# Patient Record
Sex: Male | Born: 1945
Health system: Southern US, Community
[De-identification: ages and names within clinical notes are randomized; demographics above are authoritative.]

## PROBLEM LIST (undated history)

## (undated) DIAGNOSIS — T7840XA Allergy, unspecified, initial encounter: Secondary | ICD-10-CM

## (undated) DIAGNOSIS — E785 Hyperlipidemia, unspecified: Secondary | ICD-10-CM

## (undated) DIAGNOSIS — I10 Essential (primary) hypertension: Secondary | ICD-10-CM

## (undated) DIAGNOSIS — N529 Male erectile dysfunction, unspecified: Secondary | ICD-10-CM

## (undated) DIAGNOSIS — D369 Benign neoplasm, unspecified site: Secondary | ICD-10-CM

## (undated) HISTORY — DX: Hyperlipidemia, unspecified: E78.5

## (undated) HISTORY — DX: Essential (primary) hypertension: I10

## (undated) HISTORY — DX: Benign neoplasm, unspecified site: D36.9

## (undated) HISTORY — DX: Male erectile dysfunction, unspecified: N52.9

## (undated) HISTORY — DX: Allergy, unspecified, initial encounter: T78.40XA

## (undated) HISTORY — PX: JOINT REPLACEMENT: SHX530

---

## 1999-06-19 ENCOUNTER — Encounter (INDEPENDENT_AMBULATORY_CARE_PROVIDER_SITE_OTHER): Payer: Self-pay | Admitting: Specialist

## 1999-06-19 ENCOUNTER — Other Ambulatory Visit: Admission: RE | Admit: 1999-06-19 | Discharge: 1999-06-19 | Payer: Self-pay | Admitting: Gastroenterology

## 2001-11-30 ENCOUNTER — Encounter: Payer: Self-pay | Admitting: Internal Medicine

## 2001-11-30 ENCOUNTER — Encounter: Admission: RE | Admit: 2001-11-30 | Discharge: 2001-11-30 | Payer: Self-pay | Admitting: Internal Medicine

## 2001-12-08 ENCOUNTER — Encounter: Admission: RE | Admit: 2001-12-08 | Discharge: 2001-12-08 | Payer: Self-pay | Admitting: Internal Medicine

## 2001-12-08 ENCOUNTER — Encounter: Payer: Self-pay | Admitting: Internal Medicine

## 2002-04-13 ENCOUNTER — Encounter: Payer: Self-pay | Admitting: Internal Medicine

## 2002-04-13 ENCOUNTER — Encounter: Admission: RE | Admit: 2002-04-13 | Discharge: 2002-04-13 | Payer: Self-pay | Admitting: Internal Medicine

## 2007-05-18 ENCOUNTER — Ambulatory Visit: Payer: Self-pay | Admitting: Gastroenterology

## 2007-05-31 ENCOUNTER — Ambulatory Visit: Payer: Self-pay | Admitting: Gastroenterology

## 2008-08-22 ENCOUNTER — Ambulatory Visit: Payer: Self-pay | Admitting: Internal Medicine

## 2009-02-13 ENCOUNTER — Ambulatory Visit: Payer: Self-pay | Admitting: Internal Medicine

## 2009-07-25 ENCOUNTER — Ambulatory Visit: Payer: Self-pay | Admitting: Internal Medicine

## 2009-09-25 ENCOUNTER — Ambulatory Visit: Payer: Self-pay | Admitting: Internal Medicine

## 2009-12-25 ENCOUNTER — Ambulatory Visit: Payer: Self-pay | Admitting: Internal Medicine

## 2010-06-07 ENCOUNTER — Ambulatory Visit: Payer: Self-pay | Admitting: Internal Medicine

## 2010-07-23 ENCOUNTER — Ambulatory Visit: Payer: Self-pay | Admitting: Internal Medicine

## 2010-10-08 ENCOUNTER — Ambulatory Visit: Payer: Self-pay | Admitting: Internal Medicine

## 2011-07-30 ENCOUNTER — Ambulatory Visit (INDEPENDENT_AMBULATORY_CARE_PROVIDER_SITE_OTHER): Payer: BC Managed Care – PPO | Admitting: Internal Medicine

## 2011-07-30 DIAGNOSIS — Z23 Encounter for immunization: Secondary | ICD-10-CM

## 2011-10-08 ENCOUNTER — Encounter: Payer: Self-pay | Admitting: Internal Medicine

## 2011-10-09 ENCOUNTER — Other Ambulatory Visit: Payer: Self-pay | Admitting: Internal Medicine

## 2011-10-09 ENCOUNTER — Other Ambulatory Visit: Payer: BC Managed Care – PPO | Admitting: Internal Medicine

## 2011-10-09 DIAGNOSIS — I10 Essential (primary) hypertension: Secondary | ICD-10-CM

## 2011-10-09 DIAGNOSIS — E785 Hyperlipidemia, unspecified: Secondary | ICD-10-CM

## 2011-10-09 LAB — LIPID PANEL
Cholesterol: 210 mg/dL — ABNORMAL HIGH (ref 0–200)
LDL Cholesterol: 119 mg/dL — ABNORMAL HIGH (ref 0–99)
Triglycerides: 51 mg/dL (ref <150)

## 2011-10-09 LAB — CBC WITH DIFFERENTIAL/PLATELET
Basophils Absolute: 0 10*3/uL (ref 0.0–0.1)
Basophils Relative: 1 % (ref 0–1)
MCHC: 32 g/dL (ref 30.0–36.0)
Monocytes Absolute: 1 10*3/uL (ref 0.1–1.0)
Neutro Abs: 2 10*3/uL (ref 1.7–7.7)
Neutrophils Relative %: 37 % — ABNORMAL LOW (ref 43–77)
Platelets: 235 10*3/uL (ref 150–400)
RDW: 14.2 % (ref 11.5–15.5)

## 2011-10-09 LAB — COMPREHENSIVE METABOLIC PANEL
ALT: 30 U/L (ref 0–53)
CO2: 28 mEq/L (ref 19–32)
Calcium: 10 mg/dL (ref 8.4–10.5)
Chloride: 101 mEq/L (ref 96–112)
Creat: 0.86 mg/dL (ref 0.50–1.35)
Total Protein: 7 g/dL (ref 6.0–8.3)

## 2011-10-10 ENCOUNTER — Encounter: Payer: Self-pay | Admitting: Internal Medicine

## 2011-10-10 ENCOUNTER — Ambulatory Visit (INDEPENDENT_AMBULATORY_CARE_PROVIDER_SITE_OTHER): Payer: BC Managed Care – PPO | Admitting: Internal Medicine

## 2011-10-10 DIAGNOSIS — I10 Essential (primary) hypertension: Secondary | ICD-10-CM

## 2011-10-10 DIAGNOSIS — F411 Generalized anxiety disorder: Secondary | ICD-10-CM

## 2011-10-10 DIAGNOSIS — J309 Allergic rhinitis, unspecified: Secondary | ICD-10-CM

## 2011-10-10 DIAGNOSIS — F419 Anxiety disorder, unspecified: Secondary | ICD-10-CM

## 2011-10-10 DIAGNOSIS — E785 Hyperlipidemia, unspecified: Secondary | ICD-10-CM

## 2011-10-10 DIAGNOSIS — N529 Male erectile dysfunction, unspecified: Secondary | ICD-10-CM

## 2011-10-10 LAB — PSA: PSA: 1.84 ng/mL (ref <=4.00)

## 2011-10-16 ENCOUNTER — Other Ambulatory Visit: Payer: Self-pay

## 2011-10-16 MED ORDER — LISINOPRIL 20 MG PO TABS: 20.0000 mg | ORAL_TABLET | Freq: Every day | ORAL | Status: DC

## 2011-10-16 MED ORDER — ROSUVASTATIN CALCIUM 10 MG PO TABS: 10.0000 mg | ORAL_TABLET | Freq: Every day | ORAL | Status: DC

## 2011-10-16 MED ORDER — HYDROCHLOROTHIAZIDE 25 MG PO TABS: 25.0000 mg | ORAL_TABLET | Freq: Every day | ORAL | Status: DC

## 2011-10-24 ENCOUNTER — Other Ambulatory Visit: Payer: BC Managed Care – PPO | Admitting: Internal Medicine

## 2011-10-27 ENCOUNTER — Encounter: Payer: Self-pay | Admitting: Internal Medicine

## 2011-10-27 ENCOUNTER — Encounter: Payer: BC Managed Care – PPO | Admitting: Internal Medicine

## 2011-10-27 DIAGNOSIS — I10 Essential (primary) hypertension: Secondary | ICD-10-CM | POA: Insufficient documentation

## 2011-10-27 DIAGNOSIS — N529 Male erectile dysfunction, unspecified: Secondary | ICD-10-CM | POA: Insufficient documentation

## 2011-10-27 DIAGNOSIS — F419 Anxiety disorder, unspecified: Secondary | ICD-10-CM | POA: Insufficient documentation

## 2011-10-27 DIAGNOSIS — E785 Hyperlipidemia, unspecified: Secondary | ICD-10-CM | POA: Insufficient documentation

## 2011-10-27 DIAGNOSIS — J309 Allergic rhinitis, unspecified: Secondary | ICD-10-CM | POA: Insufficient documentation

## 2011-10-27 NOTE — Patient Instructions (Addendum)
Continue same medications. We will notify you with results regarding hepatitis C antibody and other fasting lab work. Return in 6 months. Continue to watch diet and exercise.

## 2011-10-27 NOTE — Progress Notes (Signed)
  Subjective:    Patient ID: Andrew Cole, male    DOB: [birth year], 65 y.o.   MRN: [id number]  HPI 65 year old white male with history of hypertension, allergic rhinitis, anxiety, hyperlipidemia, erectile dysfunction in today for evaluation of medical problems. The most part his been doing well. 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 Stays physically fit doing this type of work.    Review of Systems  Constitutional: Negative.   HENT: Positive for rhinorrhea.   Eyes: Negative.   Respiratory: Negative.   Cardiovascular: Negative.   Gastrointestinal: Negative.   Genitourinary:       Erectile dysfunction  Musculoskeletal: Positive for back pain.  Neurological: Negative.   Hematological: Negative.   Psychiatric/Behavioral:       Some anxiety at times       Objective:   Physical Exam  Vitals reviewed. Constitutional: He is oriented to person, place, and time. He appears well-developed and well-nourished. No distress.  HENT:  Head: Normocephalic and atraumatic.  Right Ear: External ear normal.  Left Ear: External ear normal.  Mouth/Throat: Oropharynx is clear and moist. No oropharyngeal exudate.  Eyes: Conjunctivae and EOM are normal. Pupils are equal, round, and reactive to light. Right eye exhibits no discharge. Left eye exhibits no discharge. No scleral icterus.  Neck: Neck supple. No JVD present. No thyromegaly present.  Cardiovascular: Normal rate, regular rhythm, normal heart sounds and intact distal pulses.   No murmur heard. Pulmonary/Chest: Effort normal and breath sounds normal. He has no wheezes. He has no rales.  Abdominal: Soft. Bowel sounds are normal. He exhibits no distension and no mass. There is no tenderness. There is no rebound and no guarding.  Genitourinary: Prostate normal.  Musculoskeletal: Normal range of motion.  Lymphadenopathy:    He has no cervical adenopathy.  Neurological: He is  alert and oriented to person, place, and time. He has normal reflexes. No cranial nerve deficit. Coordination normal.  Skin: Skin is warm and dry. No rash noted.  Psychiatric: He has a normal mood and affect. His behavior is normal. Judgment and thought content normal.          Assessment & Plan:  Hypertension  Allergic rhinitis  Anxiety  Erectile dysfunction  Hyperlipidemia  Plan: Return in 6 months for office visit, blood pressure check and lipid panel. At his request check antibody to hepatitis C. He found out that an old girlfriend apparently had died from chronic hepatitis C. Addendum hepatitis C antibody is negative

## 2011-11-10 ENCOUNTER — Other Ambulatory Visit: Payer: Self-pay | Admitting: Internal Medicine

## 2012-01-05 ENCOUNTER — Telehealth: Payer: Self-pay | Admitting: Gastroenterology

## 2012-01-05 ENCOUNTER — Telehealth: Payer: Self-pay | Admitting: Internal Medicine

## 2012-01-05 NOTE — Telephone Encounter (Signed)
01-05-12 TRANSFERRED GI CARE TO DR MEDOFF/YF

## 2012-02-23 ENCOUNTER — Other Ambulatory Visit: Payer: Self-pay | Admitting: Dermatology

## 2012-03-29 ENCOUNTER — Other Ambulatory Visit: Payer: Self-pay

## 2012-03-29 ENCOUNTER — Other Ambulatory Visit: Payer: Self-pay | Admitting: Internal Medicine

## 2012-04-12 ENCOUNTER — Other Ambulatory Visit: Payer: BC Managed Care – PPO | Admitting: Internal Medicine

## 2012-04-12 DIAGNOSIS — Z79899 Other long term (current) drug therapy: Secondary | ICD-10-CM

## 2012-04-12 DIAGNOSIS — E785 Hyperlipidemia, unspecified: Secondary | ICD-10-CM

## 2012-04-12 LAB — LIPID PANEL
Cholesterol: 208 mg/dL — ABNORMAL HIGH (ref 0–200)
LDL Cholesterol: 112 mg/dL — ABNORMAL HIGH (ref 0–99)
VLDL: 27 mg/dL (ref 0–40)

## 2012-04-12 LAB — HEPATIC FUNCTION PANEL
Bilirubin, Direct: 0.1 mg/dL (ref 0.0–0.3)
Indirect Bilirubin: 0.2 mg/dL (ref 0.0–0.9)
Total Bilirubin: 0.3 mg/dL (ref 0.3–1.2)

## 2012-04-13 ENCOUNTER — Ambulatory Visit (INDEPENDENT_AMBULATORY_CARE_PROVIDER_SITE_OTHER): Payer: BC Managed Care – PPO | Admitting: Internal Medicine

## 2012-04-13 ENCOUNTER — Encounter: Payer: Self-pay | Admitting: Internal Medicine

## 2012-04-13 VITALS — BP 136/76 | HR 80 | Temp 98.8°F | Ht 66.0 in | Wt 177.0 lb

## 2012-04-13 DIAGNOSIS — E785 Hyperlipidemia, unspecified: Secondary | ICD-10-CM

## 2012-04-13 DIAGNOSIS — I10 Essential (primary) hypertension: Secondary | ICD-10-CM

## 2012-04-13 DIAGNOSIS — J309 Allergic rhinitis, unspecified: Secondary | ICD-10-CM

## 2012-04-13 DIAGNOSIS — N529 Male erectile dysfunction, unspecified: Secondary | ICD-10-CM

## 2012-04-13 DIAGNOSIS — Z23 Encounter for immunization: Secondary | ICD-10-CM

## 2012-04-26 NOTE — Progress Notes (Signed)
  Subjective:    Patient ID: Andrew Cole, male    DOB: 07/19/1946, 66 y.o.   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 Basically doing well without any significant complaints. Needs a few medications refilled. Has had fasting lipid panel liver functions which are entirely within normal limits on lipid-lowering medication. Uses Flonase nasal spray generic for allergic rhinitis symptoms. Occasionally needs to use an albuterol inhaler. Blood pressure under excellent control on current regimen. 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    Review of Systems     Objective:   Physical Exam HEENT exam TMs and pharynx are clear. Neck is supple without JVD thyromegaly or carotid bruits. Chest clear to auscultation. Cardiac exam regular rate and rhythm normal S1 and S2. Abdomen no bruits no hepatosplenomegaly masses or tenderness. Extremities without edema. He is alert and oriented.        Assessment & Plan:  Hypertension-well controlled  Hyperlipidemia-well controlled  Allergic rhinitis-controlled  Anxiety-occasionally needs medication for that  Erectile dysfunction treated with Cialis or Viagra  Plan: Return in 6 months for physical examination. Okay to refill medications until that time.

## 2012-07-28 ENCOUNTER — Ambulatory Visit (INDEPENDENT_AMBULATORY_CARE_PROVIDER_SITE_OTHER): Payer: Medicare Other | Admitting: Internal Medicine

## 2012-07-28 DIAGNOSIS — Z23 Encounter for immunization: Secondary | ICD-10-CM

## 2012-10-18 ENCOUNTER — Other Ambulatory Visit: Payer: Self-pay | Admitting: Internal Medicine

## 2012-11-04 ENCOUNTER — Other Ambulatory Visit: Payer: Self-pay

## 2012-11-04 MED ORDER — LISINOPRIL 20 MG PO TABS: 20.0000 mg | ORAL_TABLET | Freq: Every day | ORAL | Status: DC

## 2012-11-04 MED ORDER — HYDROCHLOROTHIAZIDE 25 MG PO TABS: 25.0000 mg | ORAL_TABLET | Freq: Every day | ORAL | Status: DC

## 2012-11-04 MED ORDER — ROSUVASTATIN CALCIUM 10 MG PO TABS: 10.0000 mg | ORAL_TABLET | Freq: Every day | ORAL | Status: DC

## 2012-11-07 ENCOUNTER — Other Ambulatory Visit: Payer: Self-pay | Admitting: Internal Medicine

## 2012-11-09 ENCOUNTER — Other Ambulatory Visit: Payer: Self-pay

## 2012-11-09 MED ORDER — CRESTOR 10 MG PO TABS: 10.0000 mg | ORAL_TABLET | Freq: Every day | ORAL | Status: DC

## 2012-11-09 MED ORDER — HYDROCHLOROTHIAZIDE 25 MG PO TABS: 25.0000 mg | ORAL_TABLET | Freq: Every day | ORAL | Status: DC

## 2012-11-09 MED ORDER — LISINOPRIL 20 MG PO TABS: 20.0000 mg | ORAL_TABLET | Freq: Every day | ORAL | Status: DC

## 2012-11-09 NOTE — Telephone Encounter (Signed)
Rx sent to [facility] today

## 2012-11-19 ENCOUNTER — Other Ambulatory Visit: Payer: Self-pay | Admitting: Internal Medicine

## 2012-12-20 ENCOUNTER — Other Ambulatory Visit: Payer: Self-pay | Admitting: Internal Medicine

## 2012-12-20 ENCOUNTER — Other Ambulatory Visit: Payer: Self-pay

## 2012-12-20 NOTE — Telephone Encounter (Signed)
Note from 6/13 says pt was supposed to have CPE in 6 months. Please check with michelle amd call patient.

## 2013-05-02 ENCOUNTER — Other Ambulatory Visit: Payer: Medicare Other | Admitting: Internal Medicine

## 2013-05-02 DIAGNOSIS — Z125 Encounter for screening for malignant neoplasm of prostate: Secondary | ICD-10-CM

## 2013-05-02 DIAGNOSIS — E785 Hyperlipidemia, unspecified: Secondary | ICD-10-CM

## 2013-05-02 DIAGNOSIS — Z130 Encounter for screening for diseases of the blood and blood-forming organs and certain disorders involving the immune mechanism: Secondary | ICD-10-CM

## 2013-05-02 DIAGNOSIS — I10 Essential (primary) hypertension: Secondary | ICD-10-CM

## 2013-05-02 LAB — CBC WITH DIFFERENTIAL/PLATELET
Basophils Absolute: 0 10*3/uL (ref 0.0–0.1)
Eosinophils Absolute: 0.2 10*3/uL (ref 0.0–0.7)
Eosinophils Relative: 3 % (ref 0–5)
Lymphs Abs: 1.3 10*3/uL (ref 0.7–4.0)
MCH: 32 pg (ref 26.0–34.0)
MCV: 93.5 fL (ref 78.0–100.0)
Monocytes Absolute: 0.9 10*3/uL (ref 0.1–1.0)
Platelets: 214 10*3/uL (ref 150–400)
RDW: 14.4 % (ref 11.5–15.5)

## 2013-05-02 LAB — COMPREHENSIVE METABOLIC PANEL
ALT: 39 U/L (ref 0–53)
Albumin: 4.5 g/dL (ref 3.5–5.2)
BUN: 15 mg/dL (ref 6–23)
CO2: 26 mEq/L (ref 19–32)
Calcium: 9.7 mg/dL (ref 8.4–10.5)
Chloride: 104 mEq/L (ref 96–112)
Creat: 0.95 mg/dL (ref 0.50–1.35)

## 2013-05-02 LAB — LIPID PANEL
Cholesterol: 191 mg/dL (ref 0–200)
Total CHOL/HDL Ratio: 2.5 Ratio

## 2013-05-03 ENCOUNTER — Ambulatory Visit (INDEPENDENT_AMBULATORY_CARE_PROVIDER_SITE_OTHER): Payer: Medicare Other | Admitting: Internal Medicine

## 2013-05-03 ENCOUNTER — Encounter: Payer: Self-pay | Admitting: Internal Medicine

## 2013-05-03 VITALS — BP 128/76 | HR 80 | Temp 98.8°F | Ht 66.0 in | Wt 177.0 lb

## 2013-05-03 DIAGNOSIS — J309 Allergic rhinitis, unspecified: Secondary | ICD-10-CM

## 2013-05-03 DIAGNOSIS — F411 Generalized anxiety disorder: Secondary | ICD-10-CM

## 2013-05-03 DIAGNOSIS — E785 Hyperlipidemia, unspecified: Secondary | ICD-10-CM

## 2013-05-03 DIAGNOSIS — Z86010 Personal history of colonic polyps: Secondary | ICD-10-CM

## 2013-05-03 DIAGNOSIS — Z0000 Encounter for general adult medical examination without abnormal findings: Secondary | ICD-10-CM

## 2013-05-03 DIAGNOSIS — I10 Essential (primary) hypertension: Secondary | ICD-10-CM

## 2013-05-03 DIAGNOSIS — J069 Acute upper respiratory infection, unspecified: Secondary | ICD-10-CM

## 2013-05-03 DIAGNOSIS — R972 Elevated prostate specific antigen [PSA]: Secondary | ICD-10-CM

## 2013-05-03 LAB — POCT URINALYSIS DIPSTICK
Bilirubin, UA: NEGATIVE
Glucose, UA: NEGATIVE
Leukocytes, UA: NEGATIVE
pH, UA: 6

## 2013-05-03 NOTE — Progress Notes (Signed)
Subjective:    Patient ID: Andrew Cole, male    DOB: 1946/06/23, 67 y.o.   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 Today has acute upper respiratory infection present for several days. Feels he is getting some better. Doesn't feel he needs treatment for it.  Medical problems include hypertension and hyperlipidemia. History of allergic rhinitis. History of mild anxiety. History of erectile dysfunction.  He stays physically fit doing lots of outside activities.  Review of lab work today shows elevated PSA in the slightly over 4 range. He's never had this before. He does use Viagra for erectile dysfunction. Having some BPH symptoms.  History of adenomatous colon polyps. Had colonoscopy 2008 by Dr. Jarold Motto. Has had herpes zoster vaccine in the past.  Intolerant of Zocor causes myalgias. Penicillin causes GI complaints. Is able to tolerate Crestor for hyperlipidemia.  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 He is married. 2 adult sons. Currently nonsmoker. Used to smoke. Social alcohol consumption.  Family history: Mother died with metastatic ovarian cancer. Father with history of colon polyps and hyperlipidemia. One brother in good health. 2 sisters in good health.    Review of Systems  Constitutional: Negative.   HENT: Negative.   Eyes: Negative.   Respiratory:       Recent upper respiratory infection  Cardiovascular: Negative.   Gastrointestinal:       History of adenomatous colon polyps  Endocrine: Negative.   Genitourinary:       Nocturia, erectile dysfunction, urine hesitancy  Allergic/Immunologic: Positive for environmental allergies.  Neurological: Negative.   Hematological: Negative.   Psychiatric/Behavioral:       Mild anxiety       Objective:   Physical Exam  Vitals reviewed. Constitutional: He is oriented to person, place, and time. He appears well-developed  and well-nourished. No distress.  HENT:  Head: Normocephalic and atraumatic.  Right Ear: External ear normal.  Left Ear: External ear normal.  Mouth/Throat: Oropharynx is clear and moist. No oropharyngeal exudate.  Eyes: Conjunctivae are normal. Pupils are equal, round, and reactive to light. Right eye exhibits no discharge. Left eye exhibits no discharge. No scleral icterus.  Neck: Neck supple. No JVD present. No thyromegaly present.  Cardiovascular: Normal rate, normal heart sounds and intact distal pulses.   No murmur heard. Pulmonary/Chest: Effort normal and breath sounds normal. No respiratory distress. He has no rales. He exhibits no tenderness.  Abdominal: Soft. Bowel sounds are normal. He exhibits no distension and no mass. There is no tenderness. There is no rebound and no guarding.  Genitourinary:  Slightly enlarged prostate without nodules  Musculoskeletal: Normal range of motion. He exhibits no edema.  Lymphadenopathy:    He has no cervical adenopathy.  Neurological: He is alert and oriented to person, place, and time. He has normal reflexes. No cranial nerve deficit. Coordination normal.  Skin: Skin is warm and dry. No rash noted. He is not diaphoretic.  Psychiatric: He has a normal mood and affect. Judgment and thought content normal.          Assessment & Plan:  Acute upper respiratory infection-no treatment at this time  BPH symptoms-refer to urologist  Elevated PSA-refer to urologist  Hyperlipidemia-stable with Crestor  Hypertension-stable on current medication  Allergic rhinitis-treated with steroid nasal spray  Anxiety-treated sparingly with Xanax  Plan: Urology consultation. Patient is to return here in 6 months for office visit lipid panel,  liver functions, and blood pressure check      Subjective:   Patient presents for Medicare Annual/Subsequent preventive examination.   Review Past Medical/Family/Social: see EPIC   Risk Factors  Current  exercise habits:  Dietary issues discussed:   Cardiac risk factors: HTN Hyperlipidemia  Depression Screen  (Note: if answer to either of the following is "Yes", a more complete depression screening is indicated)   Over the past two weeks, have you felt down, depressed or hopeless? No  Over the past two weeks, have you felt little interest or pleasure in doing things? No Have you lost interest or pleasure in daily life? No Do you often feel hopeless? No Do you cry easily over simple problems? No   Activities of Daily Living  In your present state of health, do you have any difficulty performing the following activities?:   Driving? No  Managing money? No  Feeding yourself? No  Getting from bed to chair? No  Climbing a flight of stairs? No  Preparing food and eating?: No  Bathing or showering? No  Getting dressed: No  Getting to the toilet? No  Using the toilet:No  Moving around from place to place: No  In the past year have you fallen or had a near fall?:No  Are you sexually active? yes Do you have more than one partner? No   Hearing Difficulties: No  Do you often ask people to speak up or repeat themselves? No  Do you experience ringing or noises in your ears? No  Do you have difficulty understanding soft or whispered voices? No  Do you feel that you have a problem with memory? No Do you often misplace items? No    Home Safety:  Do you have a smoke alarm at your residence? Yes Do you have grab bars in the bathroom? no Do you have throw rugs in your house? no   Cognitive Testing  Alert? Yes Normal Appearance?Yes  Oriented to person? Yes Place? Yes  Time? Yes  Recall of three objects? Yes  Can perform simple calculations? Yes  Displays appropriate judgment?Yes  Can read the correct time from a watch face?Yes   List the Names of Other Physician/Practitioners you currently use:  See referral list for the physicians patient is currently seeing. Dr[facility] Lucretia Roers at [facility]    Review of Systems: see EPIC   Objective:     General appearance: Appears stated age and mildly obese  Head: Normocephalic, without obvious abnormality, atraumatic  Eyes: conj clear, EOMi PEERLA  Ears: normal TM's and external ear canals both ears  Nose: Nares normal. Septum midline. Mucosa normal. No drainage or sinus tenderness.  Throat: lips, mucosa, and tongue normal; teeth and gums normal  Neck: no adenopathy, no carotid bruit, no JVD, supple, symmetrical, trachea midline and thyroid not enlarged, symmetric, no tenderness/mass/nodules  No CVA tenderness.  Lungs: clear to auscultation bilaterally  Breasts: normal appearance, no masses or tenderness, top of the pacemaker on left upper chest.  Heart: regular rate and rhythm, S1, S2 normal, no murmur, click, rub or gallop  Abdomen: soft, non-tender; bowel sounds normal; no masses, no organomegaly  Musculoskeletal: ROM normal in all joints, no crepitus, no deformity, Normal muscle strengthen. Back  is symmetric, no curvature. Skin: Skin color, texture, turgor normal. No rashes or lesions  Lymph nodes: Cervical, supraclavicular, and axillary nodes normal.  Neurologic: CN 2 -12 Normal, Normal symmetric reflexes. Normal coordination and gait  Psych: Alert & Oriented x 3,  Mood appear stable.    Assessment:    Annual wellness medicare exam   Plan:    During the course of the visit the patient was educated and counseled about appropriate screening and preventive services including:   Had previous colonoscopy and is to return in 2 years     Patient Instructions (the written plan) was given to the patient.  Medicare Attestation  I have personally reviewed:  The patient's medical and social history  Their use of alcohol, tobacco or illicit drugs  Their current medications and supplements  The patient's functional ability including ADLs,fall risks, home safety risks, cognitive, and hearing and visual impairment   Diet and physical activities  Evidence for depression or mood disorders  The patient's weight, height, BMI, and visual acuity have been recorded in the chart. I have made referrals, counseling, and provided education to the patient based on review of the above and I have provided the patient with a written personalized care plan for preventive services.

## 2013-05-08 ENCOUNTER — Encounter: Payer: Self-pay | Admitting: Internal Medicine

## 2013-05-08 DIAGNOSIS — R972 Elevated prostate specific antigen [PSA]: Secondary | ICD-10-CM | POA: Insufficient documentation

## 2013-05-08 NOTE — Patient Instructions (Addendum)
Urology consultation will be obtained. Continue same medications and return in 6 months

## 2013-08-17 ENCOUNTER — Ambulatory Visit (INDEPENDENT_AMBULATORY_CARE_PROVIDER_SITE_OTHER): Payer: 59 | Admitting: Internal Medicine

## 2013-08-17 DIAGNOSIS — Z23 Encounter for immunization: Secondary | ICD-10-CM

## 2013-11-02 ENCOUNTER — Other Ambulatory Visit: Payer: Self-pay | Admitting: Internal Medicine

## 2013-11-03 ENCOUNTER — Other Ambulatory Visit: Payer: Self-pay

## 2013-11-03 MED ORDER — ALPRAZOLAM 0.5 MG PO TABS: 0.5000 mg | ORAL_TABLET | Freq: Two times a day (BID) | ORAL | Status: DC | PRN

## 2013-11-03 NOTE — Telephone Encounter (Signed)
Refill #[id number] with one refill 

## 2013-11-04 ENCOUNTER — Other Ambulatory Visit: Payer: Self-pay | Admitting: Internal Medicine

## 2013-11-15 ENCOUNTER — Other Ambulatory Visit: Payer: Self-pay

## 2013-11-15 MED ORDER — ALBUTEROL SULFATE HFA 108 (90 BASE) MCG/ACT IN AERS: 2.0000 | INHALATION_SPRAY | Freq: Four times a day (QID) | RESPIRATORY_TRACT | Status: DC | PRN

## 2013-11-19 ENCOUNTER — Other Ambulatory Visit: Payer: Self-pay | Admitting: Internal Medicine

## 2013-11-20 NOTE — Telephone Encounter (Signed)
Please schedule for 6 month follow up if does not already have appt. Before refilling meds.

## 2013-11-21 ENCOUNTER — Other Ambulatory Visit: Payer: Self-pay

## 2013-12-20 ENCOUNTER — Other Ambulatory Visit: Payer: 59 | Admitting: Internal Medicine

## 2013-12-22 ENCOUNTER — Ambulatory Visit: Payer: 59 | Admitting: Internal Medicine

## 2014-01-17 ENCOUNTER — Other Ambulatory Visit: Payer: Medicare Other | Admitting: Internal Medicine

## 2014-01-17 DIAGNOSIS — E785 Hyperlipidemia, unspecified: Secondary | ICD-10-CM

## 2014-01-17 DIAGNOSIS — Z79899 Other long term (current) drug therapy: Secondary | ICD-10-CM

## 2014-01-17 LAB — LIPID PANEL
Cholesterol: 198 mg/dL (ref 0–200)
HDL: 87 mg/dL (ref >39)
LDL Cholesterol: 94 mg/dL (ref 0–99)
TRIGLYCERIDES: 86 mg/dL (ref <150)
Total CHOL/HDL Ratio: 2.3 Ratio
VLDL: 17 mg/dL (ref 0–40)

## 2014-01-17 LAB — HEPATIC FUNCTION PANEL
ALBUMIN: 4.7 g/dL (ref 3.5–5.2)
ALK PHOS: 44 U/L (ref 39–117)
ALT: 35 U/L (ref 0–53)
AST: 22 U/L (ref 0–37)
BILIRUBIN TOTAL: 0.5 mg/dL (ref 0.2–1.2)
Bilirubin, Direct: 0.1 mg/dL (ref 0.0–0.3)
Indirect Bilirubin: 0.4 mg/dL (ref 0.2–1.2)
TOTAL PROTEIN: 7.3 g/dL (ref 6.0–8.3)

## 2014-01-19 ENCOUNTER — Ambulatory Visit (INDEPENDENT_AMBULATORY_CARE_PROVIDER_SITE_OTHER): Payer: 59 | Admitting: Internal Medicine

## 2014-01-19 ENCOUNTER — Encounter: Payer: Self-pay | Admitting: Internal Medicine

## 2014-01-19 VITALS — BP 126/72 | HR 80 | Temp 98.8°F | Wt 180.0 lb

## 2014-01-19 DIAGNOSIS — Z23 Encounter for immunization: Secondary | ICD-10-CM

## 2014-01-19 DIAGNOSIS — I10 Essential (primary) hypertension: Secondary | ICD-10-CM

## 2014-01-19 DIAGNOSIS — E785 Hyperlipidemia, unspecified: Secondary | ICD-10-CM

## 2014-01-19 DIAGNOSIS — J309 Allergic rhinitis, unspecified: Secondary | ICD-10-CM

## 2014-01-19 DIAGNOSIS — T783XXA Angioneurotic edema, initial encounter: Secondary | ICD-10-CM

## 2014-01-19 MED ORDER — PNEUMOCOCCAL VAC POLYVALENT 25 MCG/0.5ML IJ INJ: 0.5000 mL | INJECTION | INTRAMUSCULAR | Status: DC

## 2014-01-19 NOTE — Addendum Note (Signed)
Addended by: Brett Canales on: 01/19/2014 11:35 AM   Modules accepted: Orders

## 2014-01-19 NOTE — Patient Instructions (Signed)
Pneumovax immunization given today. Continue same medications and return in 6 months. If angioedema recurs, take Benadryl by mouth immediately

## 2014-01-19 NOTE — Progress Notes (Signed)
   Subjective:    Patient ID: Andrew Cole, male    DOB: 12-Jan-1946, 68 y.o.   MRN: [id number]  HPI In today for six-month followup on hyperlipidemia and hypertension. History of allergic rhinitis and anxiety. He saw urologist recently and received a good report with history of elevated PSA. He wants to get 20 mg of Crestor and cut it in half but I don't think that's a good idea. Would prefer that he does stay with a 10 mg tablet daily. From time to time has episodes of angioedema when lipid and face  the and swelled. Told him to think about what he has eaten the night before when this happens. He can take Benadryl for it. Seldom happens. He went to his son's house recently and had issues with  allergy symptoms and difficulty breathing. Inhaler had expired. He apparently is allergic to animals. He's not had Pneumovax immunization so that will be given today.    Review of Systems     Objective:   Physical ExamSkin warm and dry. Nodes none. Neck is supple without JVD thyromegaly or carotid bruits. Chest clear to auscultation. Cardiac exam regular rate and rhythm normal S1 and S2. Extremities without edema.          Assessment & Plan:  Hyperlipidemia-lipid panel liver functions are normal on Crestor 10 mg daily. Gave him some samples of Crestorlan:    Hypertension-stable on current regimen  Hyperlipidemia-stable on Crestor 10 mg daily. Gave him some samples today.  Angioedema-is to take Benadryl immediately at onset of angioedema symptoms and consider what foods he has eaten previously to cause this. Not sure what is causing angioedema but it seldom occurs.  Elevated PSA-recently saw urologist and is stable  History of anxiety  Plan: Return in 6 months for physical exam. Pneumovax immunization given today. Continue same medications. Should angioedema recur take Benadryl immediately. Keep inhaler refilled and current.

## 2014-04-06 ENCOUNTER — Other Ambulatory Visit: Payer: Self-pay | Admitting: Internal Medicine

## 2014-04-06 NOTE — Telephone Encounter (Signed)
Due in Sept for CPE No appt booked Please call and arrange before refilling.

## 2014-04-10 ENCOUNTER — Other Ambulatory Visit: Payer: Self-pay | Admitting: Internal Medicine

## 2014-04-10 NOTE — Telephone Encounter (Signed)
Due for PE after 05/03/2014. Can only refill for 30 days. He needs CPE appt.

## 2014-04-12 ENCOUNTER — Telehealth: Payer: Self-pay | Admitting: Internal Medicine

## 2014-04-12 DIAGNOSIS — I10 Essential (primary) hypertension: Secondary | ICD-10-CM

## 2014-04-12 MED ORDER — HYDROCHLOROTHIAZIDE 25 MG PO TABS
ORAL_TABLET | ORAL | Status: DC
Start: 2014-04-12 — End: 2014-04-13

## 2014-04-12 MED ORDER — LISINOPRIL 20 MG PO TABS: ORAL_TABLET | ORAL | Status: DC

## 2014-04-12 NOTE — Telephone Encounter (Signed)
Physical exam due after 05/03/2014. Refills were denied until  patient made appointment. He called today very frustrated that he had to have physical exam. He always waits until the last-minute to book physical exam when medications are running out.  CPE booked late July. We'll refill lisinopril and HCTZ until then.

## 2014-04-13 ENCOUNTER — Other Ambulatory Visit: Payer: Self-pay

## 2014-04-13 DIAGNOSIS — I10 Essential (primary) hypertension: Secondary | ICD-10-CM

## 2014-04-13 MED ORDER — HYDROCHLOROTHIAZIDE 25 MG PO TABS: ORAL_TABLET | ORAL | Status: DC

## 2014-04-13 MED ORDER — FLUTICASONE PROPIONATE 50 MCG/ACT NA SUSP: 2.0000 | Freq: Every day | NASAL | Status: DC

## 2014-04-13 MED ORDER — CRESTOR 10 MG PO TABS: 10.0000 mg | ORAL_TABLET | Freq: Every day | ORAL | Status: DC

## 2014-04-13 MED ORDER — LISINOPRIL 20 MG PO TABS: ORAL_TABLET | ORAL | Status: DC

## 2014-05-12 ENCOUNTER — Encounter: Payer: Self-pay | Admitting: Internal Medicine

## 2014-05-12 ENCOUNTER — Telehealth: Payer: Self-pay | Admitting: Internal Medicine

## 2014-05-12 NOTE — Telephone Encounter (Signed)
Last CPE was July 2015 making 6 month recheck due in January which he did not have. He had it in March. Now wants to defer PE from July until September. We can do this this one time but WILL NOT get off schedule in the future and he needs to keep up with this. We have this argument with him everytime. Refill meds through Sept. PE will not be deferred any more after SEPT. If this is not agreeable, needs to find another physician.

## 2014-05-25 ENCOUNTER — Other Ambulatory Visit: Payer: 59 | Admitting: Internal Medicine

## 2014-05-26 ENCOUNTER — Encounter: Payer: 59 | Admitting: Internal Medicine

## 2014-07-10 ENCOUNTER — Other Ambulatory Visit: Payer: 59 | Admitting: Internal Medicine

## 2014-07-11 ENCOUNTER — Encounter: Payer: 59 | Admitting: Internal Medicine

## 2014-07-18 ENCOUNTER — Other Ambulatory Visit: Payer: 59 | Admitting: Internal Medicine

## 2014-07-20 ENCOUNTER — Other Ambulatory Visit (INDEPENDENT_AMBULATORY_CARE_PROVIDER_SITE_OTHER): Payer: 59 | Admitting: Internal Medicine

## 2014-07-20 DIAGNOSIS — E785 Hyperlipidemia, unspecified: Secondary | ICD-10-CM

## 2014-07-20 DIAGNOSIS — Z125 Encounter for screening for malignant neoplasm of prostate: Secondary | ICD-10-CM

## 2014-07-20 DIAGNOSIS — Z23 Encounter for immunization: Secondary | ICD-10-CM

## 2014-07-20 DIAGNOSIS — Z130 Encounter for screening for diseases of the blood and blood-forming organs and certain disorders involving the immune mechanism: Secondary | ICD-10-CM

## 2014-07-20 DIAGNOSIS — Z0000 Encounter for general adult medical examination without abnormal findings: Secondary | ICD-10-CM

## 2014-07-20 DIAGNOSIS — I10 Essential (primary) hypertension: Secondary | ICD-10-CM

## 2014-07-21 ENCOUNTER — Encounter: Payer: Self-pay | Admitting: Internal Medicine

## 2014-07-21 ENCOUNTER — Ambulatory Visit (INDEPENDENT_AMBULATORY_CARE_PROVIDER_SITE_OTHER): Payer: 59 | Admitting: Internal Medicine

## 2014-07-21 VITALS — BP 130/68 | HR 76 | Ht 66.0 in | Wt 182.0 lb

## 2014-07-21 DIAGNOSIS — M25569 Pain in unspecified knee: Secondary | ICD-10-CM

## 2014-07-21 DIAGNOSIS — F411 Generalized anxiety disorder: Secondary | ICD-10-CM

## 2014-07-21 DIAGNOSIS — Z0000 Encounter for general adult medical examination without abnormal findings: Secondary | ICD-10-CM

## 2014-07-21 DIAGNOSIS — R220 Localized swelling, mass and lump, head: Secondary | ICD-10-CM

## 2014-07-21 DIAGNOSIS — J309 Allergic rhinitis, unspecified: Secondary | ICD-10-CM

## 2014-07-21 DIAGNOSIS — M79609 Pain in unspecified limb: Secondary | ICD-10-CM

## 2014-07-21 DIAGNOSIS — R221 Localized swelling, mass and lump, neck: Secondary | ICD-10-CM

## 2014-07-21 DIAGNOSIS — I10 Essential (primary) hypertension: Secondary | ICD-10-CM

## 2014-07-21 DIAGNOSIS — S3992XA Unspecified injury of lower back, initial encounter: Secondary | ICD-10-CM

## 2014-07-21 DIAGNOSIS — E785 Hyperlipidemia, unspecified: Secondary | ICD-10-CM

## 2014-07-21 DIAGNOSIS — M79605 Pain in left leg: Secondary | ICD-10-CM

## 2014-07-21 DIAGNOSIS — N529 Male erectile dysfunction, unspecified: Secondary | ICD-10-CM

## 2014-07-21 LAB — LIPID PANEL
CHOLESTEROL: 173 mg/dL (ref 0–200)
HDL: 76 mg/dL (ref >39)
LDL Cholesterol: 81 mg/dL (ref 0–99)
Total CHOL/HDL Ratio: 2.3 Ratio
Triglycerides: 79 mg/dL (ref <150)
VLDL: 16 mg/dL (ref 0–40)

## 2014-07-21 LAB — COMPREHENSIVE METABOLIC PANEL
ALBUMIN: 4.6 g/dL (ref 3.5–5.2)
ALT: 27 U/L (ref 0–53)
AST: 22 U/L (ref 0–37)
Alkaline Phosphatase: 46 U/L (ref 39–117)
BILIRUBIN TOTAL: 0.5 mg/dL (ref 0.2–1.2)
BUN: 18 mg/dL (ref 6–23)
CO2: 24 meq/L (ref 19–32)
Calcium: 9.9 mg/dL (ref 8.4–10.5)
Chloride: 102 mEq/L (ref 96–112)
Creat: 0.99 mg/dL (ref 0.50–1.35)
GLUCOSE: 86 mg/dL (ref 70–99)
POTASSIUM: 4.6 meq/L (ref 3.5–5.3)
SODIUM: 139 meq/L (ref 135–145)
TOTAL PROTEIN: 7.2 g/dL (ref 6.0–8.3)

## 2014-07-21 LAB — CBC WITH DIFFERENTIAL/PLATELET
Basophils Absolute: 0 10*3/uL (ref 0.0–0.1)
Basophils Relative: 0 % (ref 0–1)
EOS ABS: 0.2 10*3/uL (ref 0.0–0.7)
Eosinophils Relative: 3 % (ref 0–5)
HEMATOCRIT: 42.3 % (ref 39.0–52.0)
HEMOGLOBIN: 14.2 g/dL (ref 13.0–17.0)
Lymphocytes Relative: 37 % (ref 12–46)
Lymphs Abs: 2.5 10*3/uL (ref 0.7–4.0)
MCH: 32.2 pg (ref 26.0–34.0)
MCHC: 33.6 g/dL (ref 30.0–36.0)
MCV: 95.9 fL (ref 78.0–100.0)
MONOS PCT: 13 % — AB (ref 3–12)
Monocytes Absolute: 0.9 10*3/uL (ref 0.1–1.0)
NEUTROS PCT: 47 % (ref 43–77)
Neutro Abs: 3.1 10*3/uL (ref 1.7–7.7)
Platelets: 270 10*3/uL (ref 150–400)
RBC: 4.41 MIL/uL (ref 4.22–5.81)
RDW: 13.9 % (ref 11.5–15.5)
WBC: 6.7 10*3/uL (ref 4.0–10.5)

## 2014-07-21 LAB — POCT URINALYSIS DIPSTICK
Bilirubin, UA: NEGATIVE
Blood, UA: NEGATIVE
GLUCOSE UA: NEGATIVE
Ketones, UA: NEGATIVE
Leukocytes, UA: NEGATIVE
Nitrite, UA: NEGATIVE
SPEC GRAV UA: 1.015
Urobilinogen, UA: NEGATIVE
pH, UA: 6

## 2014-07-21 LAB — PSA: PSA: 1.73 ng/mL (ref <=4.00)

## 2014-07-22 ENCOUNTER — Encounter: Payer: Self-pay | Admitting: Internal Medicine

## 2014-07-22 NOTE — Progress Notes (Signed)
Subjective:    Patient ID: Andrew Cole, male    DOB: 11/11/1945, 68 y.o.   MRN: [id number]  HPI 68 year old white male in today for health maintenance exam and evaluation of medical issues including hypertension, hyperlipidemia, allergic rhinitis, anxiety. History of elevated PSA on one occasion but has returned to within normal limits. History of erectile dysfunction. Since last visit, patient's right cheek became puffy on one occasion. His dentist referred him to an endodontist. He was treated with some antibiotics. At one point the other cheek became puffy. He does recall any foods that could have caused these cheeks to swell. He had no itching or angioedema-type symptoms other than the puffiness and redness of the cheeks. He was told it could be parotid duct problem. He's been having some issues with back pain since he wrenched his back sorting out clams at the beach in June. Pain around his left quadriceps muscle.  He stays physically fit doing a lot of outside activities. In 2014 the PSA was slightly over the 4 range. He saw urologist for examination. Has some BPH symptoms.  History of adenomatous colon polyps. Had colonoscopy in 2008 by Dr. Sharlett Iles. Has had herpes zoster vaccine in the past.  Intolerant of Zocor causes myalgias. Is able to tolerate Crestor. Penicillin causes GI complaints.  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 He is married. 2 adult sons. He used to smoke but is currently a nonsmoker. Social alcohol consumption.  Family history: Mother died with metastatic ovarian cancer. Father with history of colon polyps and hyperlipidemia apparently died with end-stage COPD. One brother in good health. 2 sisters in good health.    Review of Systems  issues with cheek swelling as described above and back pain. Otherwise negative     Objective:   Physical Exam  Vitals reviewed. Constitutional: He appears well-developed and well-nourished.  No distress.  HENT:  Head: Normocephalic and atraumatic.  Right Ear: External ear normal.  Left Ear: External ear normal.  Mouth/Throat: Oropharynx is clear and moist. No oropharyngeal exudate.  Eyes: Conjunctivae and EOM are normal. Pupils are equal, round, and reactive to light. Right eye exhibits no discharge. Left eye exhibits no discharge. No scleral icterus.  Neck: Neck supple. No JVD present. No thyromegaly present.  Cardiovascular: Normal rate, normal heart sounds and intact distal pulses.   No murmur heard. Pulmonary/Chest: Effort normal and breath sounds normal. He has no wheezes.  Abdominal: Soft. Bowel sounds are normal. He exhibits no distension and no mass. There is no tenderness. There is no rebound.  Genitourinary: Prostate normal.  Musculoskeletal: Normal range of motion. He exhibits no edema.  tender left quadriceps muscle. Straight leg raising is negative at 90. Deep tendon reflexes 2+ and symmetrical. Muscle strength normal in the lower extremity  Lymphadenopathy:    He has no cervical adenopathy.  Skin: Skin is warm and dry. No rash noted. He is not diaphoretic.  Psychiatric: He has a normal mood and affect. His behavior is normal. Judgment and thought content normal.          Assessment & Plan:  ? Angioedema versus parotid duct obstruction causing swelling in cheeks  Back injury- resulting in left quadriceps muscle pain  Hypertension  Hyperlipidemia  Erectile dysfunction  Allergic rhinitis  History of anxiety  Plan: Return in one year or as needed. He is to undergo physical therapy for several weeks and see back pain resolves. Prescription given for physical  therapy. If back pain does not improve with physical therapy, consider MRI of LS-spine.  Subjective:   Patient presents for Medicare Annual/Subsequent preventive examination.  Review Past Medical/Family/Social:   Risk Factors  Current exercise habits:  Dietary issues discussed:   Cardiac  risk factors:  Depression Screen  (Note: if answer to either of the following is "Yes", a more complete depression screening is indicated)   Over the past two weeks, have you felt down, depressed or hopeless? No  Over the past two weeks, have you felt little interest or pleasure in doing things? No Have you lost interest or pleasure in daily life? No Do you often feel hopeless? No Do you cry easily over simple problems? No   Activities of Daily Living  In your present state of health, do you have any difficulty performing the following activities?:   Driving? No  Managing money? No  Feeding yourself? No  Getting from bed to chair? No  Climbing a flight of stairs? No up until recently when he has left leg pain climbing stairs after back injury Preparing food and eating?: No  Bathing or showering? No  Getting dressed: No  Getting to the toilet? No  Using the toilet:No  Moving around from place to place: No  In the past year have you fallen or had a near fall?:No  Are you sexually active? No  Do you have more than one partner? No   Hearing Difficulties: No  Do you often ask people to speak up or repeat themselves? No  Do you experience ringing or noises in your ears? No  Do you have difficulty understanding soft or whispered voices? No  Do you feel that you have a problem with memory? No Do you often misplace items? No    Home Safety:  Do you have a smoke alarm at your residence? Yes Do you have grab bars in the bathroom? no Do you have throw rugs in your house? no   Cognitive Testing  Alert? Yes Normal Appearance?Yes  Oriented to person? Yes Place? Yes  Time? Yes  Recall of three objects? Yes  Can perform simple calculations? Yes  Displays appropriate judgment?Yes  Can read the correct time from a watch face?Yes   List the Names of Other Physician/Practitioners you currently use:  See referral list for the physicians patient is currently seeing.     Review of  Systems: See above   Objective:     General appearance: Appears stated age  Head: Normocephalic, without obvious abnormality, atraumatic  Eyes: conj clear, EOMi PEERLA  Ears: normal TM's and external ear canals both ears  Nose: Nares normal. Septum midline. Mucosa normal. No drainage or sinus tenderness.  Throat: lips, mucosa, and tongue normal; teeth and gums normal  Neck: no adenopathy, no carotid bruit, no JVD, supple, symmetrical, trachea midline and thyroid not enlarged, symmetric, no tenderness/mass/nodules  No CVA tenderness.  Lungs: clear to auscultation bilaterally  Breasts: normal appearance, no masses or tenderness Heart: regular rate and rhythm, S1, S2 normal, no murmur, click, rub or gallop  Abdomen: soft, non-tender; bowel sounds normal; no masses, no organomegaly  Musculoskeletal: ROM normal in all joints, no crepitus, no deformity, Normal muscle strengthen. Back  is symmetric, no curvature. Skin: Skin color, texture, turgor normal. No rashes or lesions  Lymph nodes: Cervical, supraclavicular, and axillary nodes normal.  Neurologic: CN 2 -12 Normal, Normal symmetric reflexes. Normal coordination and gait  Psych: Alert & Oriented x 3, Mood appear stable.  Assessment:    Annual wellness medicare exam   Plan:    During the course of the visit the patient was educated and counseled about appropriate screening and preventive services including:   Annual PSA     Patient Instructions (the written plan) was given to the patient.  Medicare Attestation  I have personally reviewed:  The patient's medical and social history  Their use of alcohol, tobacco or illicit drugs  Their current medications and supplements  The patient's functional ability including ADLs,fall risks, home safety risks, cognitive, and hearing and visual impairment  Diet and physical activities  Evidence for depression or mood disorders  The patient's weight, height, BMI, and visual acuity have  been recorded in the chart. I have made referrals, counseling, and provided education to the patient based on review of the above and I have provided the patient with a written personalized care plan for preventive services.

## 2014-07-23 NOTE — Patient Instructions (Signed)
Ordered physical therapy for left leg pain and history of back injury. Try this for 3 weeks and let us know symptoms improved. Call us if right cheek swells again. Otherwise return in one year or as needed.

## 2014-08-14 ENCOUNTER — Other Ambulatory Visit: Payer: Self-pay

## 2014-08-14 MED ORDER — ALPRAZOLAM 0.5 MG PO TABS: ORAL_TABLET | ORAL | Status: DC

## 2014-08-14 NOTE — Telephone Encounter (Signed)
Xanax called in per Dr Renold Genta.

## 2014-09-04 ENCOUNTER — Other Ambulatory Visit: Payer: Self-pay | Admitting: Internal Medicine

## 2015-03-20 ENCOUNTER — Encounter: Payer: Self-pay | Admitting: Internal Medicine

## 2015-04-24 ENCOUNTER — Other Ambulatory Visit: Payer: Self-pay | Admitting: *Deleted

## 2015-04-24 MED ORDER — ALPRAZOLAM 0.5 MG PO TABS: ORAL_TABLET | ORAL | Status: DC

## 2015-04-24 NOTE — Telephone Encounter (Signed)
Refilled patients xanax. 

## 2015-05-25 ENCOUNTER — Other Ambulatory Visit: Payer: Self-pay | Admitting: Internal Medicine

## 2015-05-25 NOTE — Telephone Encounter (Signed)
Spoke with patient regarding appt for CPE he states he will have to check with his wife about dates and call us back they are going to be out of town first part of October . He will call back to schedule.

## 2015-05-25 NOTE — Telephone Encounter (Signed)
Due for CPE after 9/25 Please call before refilling

## 2015-08-16 ENCOUNTER — Other Ambulatory Visit: Payer: Self-pay | Admitting: Internal Medicine

## 2015-08-17 LAB — LIPID PANEL
CHOL/HDL RATIO: 2.6 ratio (ref <=5.0)
Cholesterol: 190 mg/dL (ref 125–200)
HDL: 74 mg/dL (ref >=40)
LDL CALC: 94 mg/dL (ref <130)
TRIGLYCERIDES: 108 mg/dL (ref <150)
VLDL: 22 mg/dL (ref <30)

## 2015-08-17 LAB — COMPLETE METABOLIC PANEL WITH GFR
ALT: 28 U/L (ref 9–46)
AST: 18 U/L (ref 10–35)
Albumin: 4.2 g/dL (ref 3.6–5.1)
Alkaline Phosphatase: 40 U/L (ref 40–115)
BUN: 21 mg/dL (ref 7–25)
CALCIUM: 9.4 mg/dL (ref 8.6–10.3)
CHLORIDE: 102 mmol/L (ref 98–110)
CO2: 26 mmol/L (ref 20–31)
Creat: 0.94 mg/dL (ref 0.70–1.25)
GFR, Est African American: >89 mL/min (ref >=60)
GFR, Est Non African American: 83 mL/min (ref >=60)
Glucose, Bld: 78 mg/dL (ref 65–99)
POTASSIUM: 4.2 mmol/L (ref 3.5–5.3)
SODIUM: 139 mmol/L (ref 135–146)
Total Bilirubin: 0.7 mg/dL (ref 0.2–1.2)
Total Protein: 7 g/dL (ref 6.1–8.1)

## 2015-08-17 LAB — CBC WITH DIFFERENTIAL/PLATELET
Basophils Absolute: 0 10*3/uL (ref 0.0–0.1)
Basophils Relative: 0 % (ref 0–1)
Eosinophils Absolute: 0.3 10*3/uL (ref 0.0–0.7)
Eosinophils Relative: 4 % (ref 0–5)
HCT: 44.8 % (ref 39.0–52.0)
HEMOGLOBIN: 15 g/dL (ref 13.0–17.0)
LYMPHS ABS: 2.6 10*3/uL (ref 0.7–4.0)
LYMPHS PCT: 38 % (ref 12–46)
MCH: 32.6 pg (ref 26.0–34.0)
MCHC: 33.5 g/dL (ref 30.0–36.0)
MCV: 97.4 fL (ref 78.0–100.0)
MONO ABS: 1 10*3/uL (ref 0.1–1.0)
MPV: 9.4 fL (ref 8.6–12.4)
Monocytes Relative: 14 % — ABNORMAL HIGH (ref 3–12)
NEUTROS ABS: 3 10*3/uL (ref 1.7–7.7)
Neutrophils Relative %: 44 % (ref 43–77)
Platelets: 229 10*3/uL (ref 150–400)
RBC: 4.6 MIL/uL (ref 4.22–5.81)
RDW: 13.9 % (ref 11.5–15.5)
WBC: 6.8 10*3/uL (ref 4.0–10.5)

## 2015-08-18 LAB — PSA: PSA: 1.97 ng/mL (ref <=4.00)

## 2015-08-20 ENCOUNTER — Encounter: Payer: Self-pay | Admitting: Internal Medicine

## 2015-08-20 ENCOUNTER — Ambulatory Visit (INDEPENDENT_AMBULATORY_CARE_PROVIDER_SITE_OTHER): Payer: Medicare Other | Admitting: Internal Medicine

## 2015-08-20 VITALS — BP 132/70 | HR 87 | Temp 97.8°F | Resp 18 | Ht 67.0 in | Wt 184.5 lb

## 2015-08-20 DIAGNOSIS — N400 Enlarged prostate without lower urinary tract symptoms: Secondary | ICD-10-CM

## 2015-08-20 DIAGNOSIS — J309 Allergic rhinitis, unspecified: Secondary | ICD-10-CM

## 2015-08-20 DIAGNOSIS — Z23 Encounter for immunization: Secondary | ICD-10-CM | POA: Diagnosis not present

## 2015-08-20 DIAGNOSIS — I10 Essential (primary) hypertension: Secondary | ICD-10-CM

## 2015-08-20 DIAGNOSIS — Z0000 Encounter for general adult medical examination without abnormal findings: Secondary | ICD-10-CM

## 2015-08-20 DIAGNOSIS — F411 Generalized anxiety disorder: Secondary | ICD-10-CM

## 2015-08-20 DIAGNOSIS — N529 Male erectile dysfunction, unspecified: Secondary | ICD-10-CM

## 2015-08-20 DIAGNOSIS — E785 Hyperlipidemia, unspecified: Secondary | ICD-10-CM

## 2015-08-20 LAB — POCT URINALYSIS DIPSTICK
BILIRUBIN UA: NEGATIVE
GLUCOSE UA: NEGATIVE
LEUKOCYTES UA: NEGATIVE
NITRITE UA: NEGATIVE
PH UA: 7.5
RBC UA: NEGATIVE
Spec Grav, UA: >=1.03
Urobilinogen, UA: 0.2

## 2015-08-20 NOTE — Progress Notes (Signed)
Subjective:    Patient ID: Andrew Cole, male    DOB: 08-08-1946, 69 y.o.   MRN: [id number]  HPI 69 year old white male in today for health maintenance exam. He's gained 2-1/2 pounds since last visit September 2015. He has a history of essential hypertension, hyperlipidemia, erectile dysfunction, low back pain, allergic rhinitis, anxiety. History of elevated PSA and one occasion but it has returned within normal limits over the past 2 years. He saw urologist at that time who thought he might have some mild BPH and prostatitis. PSA at that time in 2014 was in the 4 range.  History of adenomatous colon polyps. Had colonoscopy in 2008 by Dr. Sharlett Iles.  Has had herpes zoster vaccine in the past. Received flu vaccine and Prevnar today.  Intolerant of Zocor causes myalgias. Unable to tolerate Crestor.  Penicillin causes GI complaints.  He stays physically fit and a lot of outside activities.    Review of Systems  Constitutional: Negative.   Respiratory: Negative.   Cardiovascular: Negative.   Genitourinary: Negative.   Musculoskeletal:       Back pain resolved after going to physical therapy. Leg pain resolved after learning to sleep with pillow between his knees  Neurological: Negative.        Objective:   Physical Exam  Constitutional: He is oriented to person, place, and time. He appears well-developed and well-nourished. No distress.  HENT:  Head: Normocephalic and atraumatic.  Right Ear: External ear normal.  Left Ear: External ear normal.  Mouth/Throat: Oropharynx is clear and moist. No oropharyngeal exudate.  Eyes: Conjunctivae and EOM are normal. Pupils are equal, round, and reactive to light. Right eye exhibits no discharge. Left eye exhibits no discharge. No scleral icterus.  Neck: Neck supple. No JVD present. No thyromegaly present.  Cardiovascular: Normal rate, regular rhythm, normal heart sounds and intact distal pulses.   No murmur heard. Pulmonary/Chest: Effort  normal and breath sounds normal. He has no wheezes. He has no rales.  Abdominal: Soft. Bowel sounds are normal. He exhibits no distension. There is no tenderness. There is no rebound and no guarding.  Genitourinary: Prostate normal.  Musculoskeletal: Normal range of motion. He exhibits no edema.  Neurological: He is alert and oriented to person, place, and time. He has normal reflexes. No cranial nerve deficit. Coordination normal.  Skin: Skin is warm and dry. No rash noted. He is not diaphoretic.  Psychiatric: He has a normal mood and affect. His behavior is normal. Judgment and thought content normal.  Vitals reviewed.         Assessment & Plan:  Essential hypertension-blood pressure stable  History of erectile dysfunction  History of BPH  Hyperlipidemia-lipid panel  within normal limits.  Allergic rhinitis  History of anxiety  History of low back pain-resolved  Health maintenance-Prevnar and flu vaccine given  Plan: Return in one year or as needed. Continue same medications.  Subjective:   Patient presents for Medicare Annual/Subsequent preventive examination.  Review Past Medical/Family/Social: See above   Risk Factors  Current exercise habits: Very physically active outdoors Dietary issues discussed: Low fat low carbohydrate  Cardiac risk factors: Hypertension and hyperlipidemia  Depression Screen  (Note: if answer to either of the following is "Yes", a more complete depression screening is indicated)   Over the past two weeks, have you felt down, depressed or hopeless? No  Over the past two weeks, have you felt little interest or pleasure in doing things? No Have you lost interest or pleasure  in daily life? No Do you often feel hopeless? No Do you cry easily over simple problems? No   Activities of Daily Living  In your present state of health, do you have any difficulty performing the following activities?:   Driving? No  Managing money? No  Feeding  yourself? No  Getting from bed to chair? No  Climbing a flight of stairs? No  Preparing food and eating?: No  Bathing or showering? No  Getting dressed: No  Getting to the toilet? No  Using the toilet:No  Moving around from place to place: No  In the past year have you fallen or had a near fall?:No  Are you sexually active? No  Do you have more than one partner? No   Hearing Difficulties: No  Do you often ask people to speak up or repeat themselves? No  Do you experience ringing or noises in your ears? No  Do you have difficulty understanding soft or whispered voices? Yes Do you feel that you have a problem with memory? No Do you often misplace items? No    Home Safety:  Do you have a smoke alarm at your residence? Yes Do you have grab bars in the bathroom? No Do you have throw rugs in your house? No   Cognitive Testing  Alert? Yes Normal Appearance?Yes  Oriented to person? Yes Place? Yes  Time? Yes  Recall of three objects? Yes  Can perform simple calculations? Yes  Displays appropriate judgment?Yes  Can read the correct time from a watch face?Yes   List the Names of Other Physician/Practitioners you currently use:  See referral list for the physicians patient is currently seeing.  No chronic specialty referrals   Review of Systems: See above  Objective:     General appearance: Appears stated age and mildly obese  Head: Normocephalic, without obvious abnormality, atraumatic  Eyes: conj clear, EOMi PEERLA  Ears: normal TM's and external ear canals both ears  Nose: Nares normal. Septum midline. Mucosa normal. No drainage or sinus tenderness.  Throat: lips, mucosa, and tongue normal; teeth and gums normal  Neck: no adenopathy, no carotid bruit, no JVD, supple, symmetrical, trachea midline and thyroid not enlarged, symmetric, no tenderness/mass/nodules  No CVA tenderness.  Lungs: clear to auscultation bilaterally  Breasts: normal appearance, no masses or  tenderness Heart: regular rate and rhythm, S1, S2 normal, no murmur, click, rub or gallop  Abdomen: soft, non-tender; bowel sounds normal; no masses, no organomegaly  Musculoskeletal: ROM normal in all joints, no crepitus, no deformity, Normal muscle strengthen. Back  is symmetric, no curvature. Skin: Skin color, texture, turgor normal. No rashes or lesions  Lymph nodes: Cervical, supraclavicular, and axillary nodes normal.  Neurologic: CN 2 -12 Normal, Normal symmetric reflexes. Normal coordination and gait  Psych: Alert & Oriented x 3, Mood appear stable.    Assessment:    Annual wellness medicare exam   Plan:    During the course of the visit the patient was educated and counseled about appropriate screening and preventive services including:  Prevnar and flu vaccines      Patient Instructions (the written plan) was given to the patient.  Medicare Attestation  I have personally reviewed:  The patient's medical and social history  Their use of alcohol, tobacco or illicit drugs  Their current medications and supplements  The patient's functional ability including ADLs,fall risks, home safety risks, cognitive, and hearing and visual impairment  Diet and physical activities  Evidence for depression or mood disorders  The patient's weight, height, BMI, and visual acuity have been recorded in the chart. I have made referrals, counseling, and provided education to the patient based on review of the above and I have provided the patient with a written personalized care plan for preventive services.

## 2015-08-20 NOTE — Addendum Note (Signed)
Addended by: Beryle Quant on: 08/20/2015 11:27 AM   Modules accepted: Orders

## 2015-08-20 NOTE — Patient Instructions (Signed)
It was pleasure to see you today. Lab work is within normal limits. Continue same medications and return in one year. Prevnar and flu vaccines given.

## 2015-09-03 ENCOUNTER — Other Ambulatory Visit: Payer: Self-pay | Admitting: Internal Medicine

## 2015-10-22 ENCOUNTER — Other Ambulatory Visit: Payer: Self-pay | Admitting: Internal Medicine

## 2015-11-05 ENCOUNTER — Other Ambulatory Visit: Payer: Self-pay | Admitting: Internal Medicine

## 2015-11-05 NOTE — Telephone Encounter (Signed)
Phoned to pharmacy 

## 2015-11-05 NOTE — Telephone Encounter (Signed)
Refill once 

## 2016-01-07 ENCOUNTER — Other Ambulatory Visit: Payer: Self-pay | Admitting: Internal Medicine

## 2016-01-07 NOTE — Telephone Encounter (Signed)
Please book CPE after Oct 24. Ok to refill until then

## 2016-01-07 NOTE — Telephone Encounter (Signed)
Patient contacted and states that he will have to call us back closer to time as he does not have his calender available just yet

## 2016-01-29 ENCOUNTER — Other Ambulatory Visit: Payer: Self-pay | Admitting: Internal Medicine

## 2016-02-04 ENCOUNTER — Other Ambulatory Visit: Payer: Self-pay | Admitting: Internal Medicine

## 2016-02-04 NOTE — Telephone Encounter (Signed)
Refill x 6 months. Go ahead and book CPE after October 24

## 2016-04-26 HISTORY — PX: CATARACT EXTRACTION, BILATERAL: SHX1313

## 2016-05-28 ENCOUNTER — Other Ambulatory Visit: Payer: Self-pay | Admitting: Internal Medicine

## 2016-06-11 ENCOUNTER — Other Ambulatory Visit: Payer: Self-pay | Admitting: Internal Medicine

## 2016-06-12 NOTE — Telephone Encounter (Signed)
Refill once. Has CPE October

## 2016-08-06 ENCOUNTER — Other Ambulatory Visit: Payer: Self-pay | Admitting: Internal Medicine

## 2016-08-06 NOTE — Telephone Encounter (Signed)
Verbal order by Dr. Renold Genta to refill Crestor 10mg , #90, 0 refills.  To [facility] to be e-scribed.

## 2016-08-21 ENCOUNTER — Other Ambulatory Visit (INDEPENDENT_AMBULATORY_CARE_PROVIDER_SITE_OTHER): Payer: Medicare Other | Admitting: Internal Medicine

## 2016-08-21 DIAGNOSIS — Z0000 Encounter for general adult medical examination without abnormal findings: Secondary | ICD-10-CM | POA: Diagnosis not present

## 2016-08-21 DIAGNOSIS — Z125 Encounter for screening for malignant neoplasm of prostate: Secondary | ICD-10-CM

## 2016-08-21 DIAGNOSIS — I10 Essential (primary) hypertension: Secondary | ICD-10-CM

## 2016-08-21 LAB — COMPLETE METABOLIC PANEL WITH GFR
ALT: 31 U/L (ref 9–46)
AST: 24 U/L (ref 10–35)
Albumin: 4.5 g/dL (ref 3.6–5.1)
Alkaline Phosphatase: 41 U/L (ref 40–115)
BILIRUBIN TOTAL: 0.6 mg/dL (ref 0.2–1.2)
BUN: 23 mg/dL (ref 7–25)
CALCIUM: 9.8 mg/dL (ref 8.6–10.3)
CO2: 26 mmol/L (ref 20–31)
CREATININE: 1.14 mg/dL (ref 0.70–1.25)
Chloride: 100 mmol/L (ref 98–110)
GFR, EST AFRICAN AMERICAN: 75 mL/min (ref >=60)
GFR, Est Non African American: 65 mL/min (ref >=60)
Glucose, Bld: 91 mg/dL (ref 65–99)
Potassium: 4.5 mmol/L (ref 3.5–5.3)
Sodium: 137 mmol/L (ref 135–146)
TOTAL PROTEIN: 7.2 g/dL (ref 6.1–8.1)

## 2016-08-21 LAB — CBC WITH DIFFERENTIAL/PLATELET
Basophils Absolute: 0 cells/uL (ref 0–200)
Basophils Relative: 0 %
EOS PCT: 2 %
Eosinophils Absolute: 126 cells/uL (ref 15–500)
HCT: 45.1 % (ref 38.5–50.0)
HEMOGLOBIN: 15.1 g/dL (ref 13.2–17.1)
LYMPHS ABS: 2520 {cells}/uL (ref 850–3900)
LYMPHS PCT: 40 %
MCH: 32.3 pg (ref 27.0–33.0)
MCHC: 33.5 g/dL (ref 32.0–36.0)
MCV: 96.4 fL (ref 80.0–100.0)
MONOS PCT: 16 %
MPV: 9.2 fL (ref 7.5–12.5)
Monocytes Absolute: 1008 cells/uL — ABNORMAL HIGH (ref 200–950)
NEUTROS PCT: 42 %
Neutro Abs: 2646 cells/uL (ref 1500–7800)
PLATELETS: 217 10*3/uL (ref 140–400)
RBC: 4.68 MIL/uL (ref 4.20–5.80)
RDW: 13.7 % (ref 11.0–15.0)
WBC: 6.3 10*3/uL (ref 3.8–10.8)

## 2016-08-21 LAB — LIPID PANEL
CHOLESTEROL: 184 mg/dL (ref 125–200)
HDL: 74 mg/dL (ref >=40)
LDL Cholesterol: 90 mg/dL (ref <130)
TRIGLYCERIDES: 101 mg/dL (ref <150)
Total CHOL/HDL Ratio: 2.5 Ratio (ref <=5.0)
VLDL: 20 mg/dL (ref <30)

## 2016-08-22 ENCOUNTER — Encounter: Payer: Self-pay | Admitting: Internal Medicine

## 2016-08-22 ENCOUNTER — Ambulatory Visit (INDEPENDENT_AMBULATORY_CARE_PROVIDER_SITE_OTHER): Payer: Medicare Other | Admitting: Internal Medicine

## 2016-08-22 VITALS — BP 120/70 | HR 92 | Temp 98.0°F | Ht 66.0 in | Wt 185.0 lb

## 2016-08-22 DIAGNOSIS — E7800 Pure hypercholesterolemia, unspecified: Secondary | ICD-10-CM | POA: Diagnosis not present

## 2016-08-22 DIAGNOSIS — I10 Essential (primary) hypertension: Secondary | ICD-10-CM

## 2016-08-22 DIAGNOSIS — J309 Allergic rhinitis, unspecified: Secondary | ICD-10-CM | POA: Diagnosis not present

## 2016-08-22 DIAGNOSIS — F411 Generalized anxiety disorder: Secondary | ICD-10-CM

## 2016-08-22 DIAGNOSIS — N529 Male erectile dysfunction, unspecified: Secondary | ICD-10-CM

## 2016-08-22 DIAGNOSIS — M79651 Pain in right thigh: Secondary | ICD-10-CM

## 2016-08-22 DIAGNOSIS — Z0000 Encounter for general adult medical examination without abnormal findings: Secondary | ICD-10-CM

## 2016-08-22 DIAGNOSIS — Z23 Encounter for immunization: Secondary | ICD-10-CM

## 2016-08-22 LAB — POC URINALSYSI DIPSTICK (AUTOMATED)
Bilirubin, UA: NEGATIVE
Glucose, UA: NEGATIVE
KETONES UA: NEGATIVE
LEUKOCYTES UA: NEGATIVE
NITRITE UA: NEGATIVE
PH UA: 6
PROTEIN UA: NEGATIVE
RBC UA: NEGATIVE
Spec Grav, UA: 1.015
Urobilinogen, UA: 0.2

## 2016-08-22 MED ORDER — ALPRAZOLAM 0.5 MG PO TABS: 0.5000 mg | ORAL_TABLET | Freq: Two times a day (BID) | ORAL | 0 refills | Status: DC | PRN

## 2016-08-22 NOTE — Patient Instructions (Signed)
It was a pleasure to see you today. Try heat on your thigh as well as Advil or Aleve over the next 2 weeks. Call if not improved. Continue same medications previously prescribed for hypertension and hyperlipidemia. Flu vaccine given today.

## 2016-08-22 NOTE — Progress Notes (Signed)
Subjective:    Patient ID: Andrew Cole, male    DOB: 04-Feb-1946, 70 y.o.   MRN: [id number]  HPI 70 year old Male in today for health maintenance exam and evaluation of medical issues. He has a history of essential hypertension, hyperlipidemia, erectile dysfunction, allergic rhinitis, anxiety. In 2014 he had a PSA in the 4 range. He was seen by urology and thought to have mild BPH and prostatitis. Since that time PSA has returned to normal.  History of adenomatous colon polyps. Had colonoscopy in 2008 by Dr. Sharlett Iles.  Intolerant of Zocor-it causes myalgias. Has been on Crestor but recently developed right thigh pain that he has attributed to Crestor. He has no known injury to the right thigh area. This awakens him from sleep at night from time to time. He has not tried in stage. Only tried Tylenol without much relief.  Social history: Married. 2 adult sons. Does not smoke. Used to smoke in the remote past. Social alcohol consumption. Former Music therapist. Has a Masters degree.  Family history: Mother died with metastatic ovarian cancer. Father with history of colon polyps and hyperlipidemia. One brother and 2 sisters    Review of Systems main complaint is right thigh pain. Denies back pain or pain in right hip joint.     Objective:   Physical Exam  Constitutional: He is oriented to person, place, and time. He appears well-developed and well-nourished. No distress.  HENT:  Head: Normocephalic and atraumatic.  Right Ear: External ear normal.  Left Ear: External ear normal.  Mouth/Throat: Oropharynx is clear and moist. No oropharyngeal exudate.  Eyes: Conjunctivae and EOM are normal. Pupils are equal, round, and reactive to light. Right eye exhibits no discharge. Left eye exhibits no discharge. No scleral icterus.  Neck: Neck supple. No JVD present. No thyromegaly present.  Cardiovascular: Normal rate, regular rhythm, normal heart sounds and intact distal pulses.   No murmur  heard. Pulmonary/Chest: Effort normal and breath sounds normal. No respiratory distress. He has no wheezes.  Abdominal: Soft. Bowel sounds are normal. He exhibits no distension and no mass. There is no tenderness. There is no rebound and no guarding.  Genitourinary: Prostate normal.  Musculoskeletal: He exhibits no edema.  No significant palpable tenderness along right thigh or quadriceps muscle. No pain with internal or external rotation of right hip. Straight leg raising is negative at 90 on the right.  Lymphadenopathy:    He has no cervical adenopathy.  Neurological: He is alert and oriented to person, place, and time. He has normal reflexes. No cranial nerve deficit. Coordination normal.  Skin: Skin is warm and dry. No rash noted. He is not diaphoretic.  Psychiatric: He has a normal mood and affect. His behavior is normal. Judgment and thought content normal.  Vitals reviewed.         Assessment & Plan:  Right thigh pain-etiology unclear. I would think that if Crestor was causing musculoskeletal issues it would be in more than one muscle. I'm wondering if this is some sort of injury or hip problem. He is going to try heat and Advil or Aleve for the next couple of weeks and see if symptoms improve. If not, we may need to x-ray right hip and right femur. He is thinking of discontinuing Crestor to see if that will help.  Essential hypertension  Hyperlipidemia. Anxiety-Xanax refilled  History of allergic rhinitis  History of erectile dysfunction  History of BPH-no issues with urination that are concerning  Plan: Return in  one year or as needed. Me know if thigh pain does not improve with conservative treatment.  Subjective:   Patient presents for Medicare Annual/Subsequent preventive examination.  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 No  Over the past two weeks, have you felt little interest or pleasure in doing things? No Have you lost interest or pleasure in daily life? No Do you often feel hopeless? No Do you cry easily over simple problems? No   Activities of Daily Living  In your present state of health, do you have any difficulty performing the following activities?:   Driving? No  Managing money? No  Feeding yourself? No  Getting from bed to chair? No  Climbing a flight of stairs? No  Preparing food and eating?: No  Bathing or showering? No  Getting dressed: No  Getting to the toilet? No  Using the toilet:No  Moving around from place to place: No  In the past year have you fallen or had a near fall?:No  Are you sexually active? No  Do you have more than one partner? No   Hearing Difficulties: No  Do you often ask people to speak up or repeat themselves? No  Do you experience ringing or noises in your ears? No  Do you have difficulty understanding soft or whispered voices? Yes Do you feel that you have a problem with memory? No Do you often misplace items? No    Home Safety:  Do you have a smoke alarm at your residence? Yes Do you have grab bars in the bathroom?No Do you have throw rugs in your house?No   Cognitive Testing  Alert? Yes Normal Appearance?Yes  Oriented to person? Yes Place? Yes  Time? Yes  Recall of three objects? Yes  Can perform simple calculations? Yes  Displays appropriate judgment?Yes  Can read the correct time from a watch face?Yes   List the Names of Other Physician/Practitioners you currently use:  See referral list for the physicians patient is currently seeing.     Review of Systems: See above   Objective:      General appearance: Appears stated age  Head: Normocephalic, without obvious abnormality, atraumatic  Eyes: conj clear, EOMi PEERLA  Ears: normal TM's and external ear canals both ears  Nose: Nares normal. Septum midline. Mucosa normal. No drainage or sinus tenderness.  Throat: lips, mucosa, and tongue normal; teeth and gums normal  Neck: no adenopathy, no carotid bruit, no JVD, supple, symmetrical, trachea midline and thyroid not enlarged, symmetric, no tenderness/mass/nodules  No CVA tenderness.  Lungs: clear to auscultation bilaterally  Breasts: normal appearance, no masses or tenderness Heart: regular rate and rhythm, S1, S2 normal, no murmur, click, rub or gallop  Abdomen: soft, non-tender; bowel sounds normal; no masses, no organomegaly  Musculoskeletal: ROM normal in all joints, no crepitus, no deformity, Normal muscle strengthen. Back  is symmetric, no curvature. Skin: Skin color, texture, turgor normal. No rashes or lesions  Lymph nodes: Cervical, supraclavicular, and axillary nodes normal.  Neurologic: CN 2 -12 Normal, Normal symmetric reflexes. Normal coordination and gait  Psych: Alert & Oriented  x 3, Mood appear stable.    Assessment:    Annual wellness medicare exam   Plan:    During the course of the visit the patient was educated and counseled about appropriate screening and preventive services including:   Annual flu vaccine recommended     Patient Instructions (the written plan) was given to the patient.  Medicare Attestation  I have personally reviewed:  The patient's medical and social history  Their use of alcohol, tobacco or illicit drugs  Their current medications and supplements  The patient's functional ability including ADLs,fall risks, home safety risks, cognitive, and hearing and visual impairment  Diet and physical activities  Evidence for depression or mood disorders  The patient's weight, height, BMI, and visual acuity have been recorded  in the chart. I have made referrals, counseling, and provided education to the patient based on review of the above and I have provided the patient with a written personalized care plan for preventive services.

## 2016-08-30 ENCOUNTER — Other Ambulatory Visit: Payer: Self-pay | Admitting: Internal Medicine

## 2016-10-18 ENCOUNTER — Other Ambulatory Visit: Payer: Self-pay | Admitting: Internal Medicine

## 2016-10-30 ENCOUNTER — Telehealth: Payer: Self-pay | Admitting: Internal Medicine

## 2016-10-30 ENCOUNTER — Emergency Department (HOSPITAL_COMMUNITY): Payer: Medicare Other

## 2016-10-30 ENCOUNTER — Encounter: Payer: Self-pay | Admitting: Internal Medicine

## 2016-10-30 ENCOUNTER — Encounter (HOSPITAL_COMMUNITY): Payer: Self-pay | Admitting: Emergency Medicine

## 2016-10-30 ENCOUNTER — Ambulatory Visit (INDEPENDENT_AMBULATORY_CARE_PROVIDER_SITE_OTHER): Payer: Medicare Other | Admitting: Internal Medicine

## 2016-10-30 ENCOUNTER — Emergency Department (HOSPITAL_COMMUNITY)
Admission: EM | Admit: 2016-10-30 | Discharge: 2016-10-31 | Disposition: A | Discharge status: Home / Self Care | Payer: Medicare Other | Attending: Emergency Medicine | Admitting: Emergency Medicine

## 2016-10-30 VITALS — BP 132/70 | HR 78 | Temp 97.7°F | Resp 18 | Wt 186.0 lb

## 2016-10-30 DIAGNOSIS — J9801 Acute bronchospasm: Secondary | ICD-10-CM | POA: Diagnosis not present

## 2016-10-30 DIAGNOSIS — I10 Essential (primary) hypertension: Secondary | ICD-10-CM | POA: Insufficient documentation

## 2016-10-30 DIAGNOSIS — Z79899 Other long term (current) drug therapy: Secondary | ICD-10-CM | POA: Diagnosis not present

## 2016-10-30 DIAGNOSIS — Z87891 Personal history of nicotine dependence: Secondary | ICD-10-CM | POA: Insufficient documentation

## 2016-10-30 DIAGNOSIS — J069 Acute upper respiratory infection, unspecified: Secondary | ICD-10-CM | POA: Diagnosis not present

## 2016-10-30 DIAGNOSIS — R05 Cough: Secondary | ICD-10-CM | POA: Diagnosis present

## 2016-10-30 LAB — BASIC METABOLIC PANEL
Anion gap: 12 (ref 5–15)
BUN: 20 mg/dL (ref 6–20)
CHLORIDE: 102 mmol/L (ref 101–111)
CO2: 25 mmol/L (ref 22–32)
Calcium: 9.7 mg/dL (ref 8.9–10.3)
Creatinine, Ser: 0.87 mg/dL (ref 0.61–1.24)
Glucose, Bld: 113 mg/dL — ABNORMAL HIGH (ref 65–99)
Potassium: 3.7 mmol/L (ref 3.5–5.1)
SODIUM: 139 mmol/L (ref 135–145)

## 2016-10-30 LAB — CBC WITH DIFFERENTIAL/PLATELET
BASOS ABS: 0 10*3/uL (ref 0.0–0.1)
Basophils Relative: 0 %
EOS ABS: 0.3 10*3/uL (ref 0.0–0.7)
EOS PCT: 4 %
HCT: 43.4 % (ref 39.0–52.0)
HEMOGLOBIN: 14.6 g/dL (ref 13.0–17.0)
LYMPHS ABS: 3.7 10*3/uL (ref 0.7–4.0)
LYMPHS PCT: 44 %
MCH: 32.4 pg (ref 26.0–34.0)
MCHC: 33.6 g/dL (ref 30.0–36.0)
MCV: 96.2 fL (ref 78.0–100.0)
Monocytes Absolute: 1 10*3/uL (ref 0.1–1.0)
Monocytes Relative: 12 %
NEUTROS PCT: 40 %
Neutro Abs: 3.3 10*3/uL (ref 1.7–7.7)
PLATELETS: 258 10*3/uL (ref 150–400)
RBC: 4.51 MIL/uL (ref 4.22–5.81)
RDW: 13.9 % (ref 11.5–15.5)
WBC: 8.4 10*3/uL (ref 4.0–10.5)

## 2016-10-30 LAB — I-STAT TROPONIN, ED: TROPONIN I, POC: 0.01 ng/mL (ref 0.00–0.08)

## 2016-10-30 MED ORDER — IPRATROPIUM-ALBUTEROL 0.5-2.5 (3) MG/3ML IN SOLN
3.0000 mL | Freq: Once | RESPIRATORY_TRACT | Status: AC
  Administered 2016-10-30: 3 mL via RESPIRATORY_TRACT
  Filled 2016-10-30: qty 3

## 2016-10-30 MED ORDER — PREDNISONE 10 MG PO TABS: 40.0000 mg | ORAL_TABLET | Freq: Every day | ORAL | 0 refills | Status: DC

## 2016-10-30 MED ORDER — AZITHROMYCIN 250 MG PO TABS: ORAL_TABLET | ORAL | 0 refills | Status: DC

## 2016-10-30 MED ORDER — BENZONATATE 100 MG PO CAPS: 100.0000 mg | ORAL_CAPSULE | Freq: Two times a day (BID) | ORAL | 0 refills | Status: DC | PRN

## 2016-10-30 MED ORDER — METHYLPREDNISOLONE SODIUM SUCC 125 MG IJ SOLR
125.0000 mg | Freq: Once | INTRAMUSCULAR | Status: AC
  Administered 2016-10-30: 125 mg via INTRAVENOUS
  Filled 2016-10-30: qty 2

## 2016-10-30 NOTE — Patient Instructions (Signed)
Zithromax Z-PAK take as directed. Tessalon Perles 100 mg 2-3 times daily as needed for cough.

## 2016-10-30 NOTE — Discharge Instructions (Signed)
Do not taken the Tessalon Perles anymore as you have a reaction to this medication. Get rechecked immediately if you have significant difficulty breathing or chest pain.

## 2016-10-30 NOTE — Progress Notes (Signed)
   Subjective:    Patient ID: Andrew Cole, male    DOB: 06-Jan-1946, 71 y.o.   MRN: [id number]  HPI  OnsetWell over a week ago of URI symptoms. No fever or shaking chills. Wife has had similar illness. He is coughing a lot at night. Has had Hycodan before but it caused a hangover. Has tried Delsym but it is not working. Has sore throat. Slight sputum production.  He brings up a new problem today saying that he had stopped his Crestor thinking it was causing leg cramps. Still has leg cramps. Has tried magnesium without success. May be a candidate for gabapentin but we are not going to address this problem today. He's here today for an acute visit    Review of Systems see above     Objective:   Physical Exam  Skin warm and dry. Nodes none. Pharynx is injected without exudate. TMs are clear bilaterally. Neck is supple without adenopathy. Chest clear to auscultation without rales or wheezing      Assessment & Plan:  Acute URI  Plan: Zithromax Z-PAK take 2 tablets day one followed by 1 tablet days 2 through 5. Tessalon Perles 100 mg 2-3 times daily as needed for cough.  Leg cramps-will need another visit to determine what to do about this if he so desires as he was worked in today for an acute URI

## 2016-10-30 NOTE — ED Triage Notes (Signed)
Pt reports to ER for allergic reaction; pt took tessalon pearl for his cough about 30 minutes ago; 10 minutes after taking the new med, he felt his chest get tight and started coughing almost continuously; SpO2 93%; minor tongue swelling noted

## 2016-10-30 NOTE — ED Notes (Signed)
Gave EKG to Dr. Ralene Bathe, MD for review.

## 2016-10-30 NOTE — Telephone Encounter (Signed)
Pt made aware. Appt scheduled for 10/30/16 @ 10:30a.

## 2016-10-30 NOTE — Telephone Encounter (Signed)
Cannot prescribe this without office visit.

## 2016-10-30 NOTE — ED Provider Notes (Signed)
[facility] DEPT Provider Note   CSN: [id number] Arrival date & time: 10/30/16  2144     History   Chief Complaint Chief Complaint  Patient presents with  . Allergic Reaction    HPI Andrew Cole is a 71 y.o. male.  The history is provided by the patient. No language interpreter was used.  Allergic Reaction    Andrew Cole is a 71 y.o. male who presents to the Emergency Department complaining of cough.  He saw his PCP today for cough for the last 10 days. He was prescribed Tessalon Perles for the cough. He took one of the pills and denies chewing it or biting it. About 10 minutes later he developed severe worsening of his coughing with chest tightness and feeling like he's filling with fluid. No tongue swelling or throat swelling. He has associated shortness of breath. No fevers. Symptoms are severe and constant nature.  Past Medical History:  Diagnosis Date  . Adenomatous polyps   . Allergy   . Asthma   . Hyperlipidemia   . Hypertension   . Impotence     Patient Active Problem List   Diagnosis Date Noted  . Elevated PSA 05/08/2013  . Hypertension 10/27/2011  . Allergic rhinitis 10/27/2011  . Erectile dysfunction 10/27/2011  . Anxiety 10/27/2011  . Hyperlipidemia 10/27/2011    Past Surgical History:  Procedure Laterality Date  . CATARACT EXTRACTION, BILATERAL Bilateral 04/2016       Home Medications    Prior to Admission medications   Medication Sig Start Date End Date Taking? Authorizing Provider  ALPRAZolam Duanne Moron) 0.5 MG tablet Take 1 tablet (0.5 mg total) by mouth 2 (two) times daily as needed. 08/22/16   Elby Showers, MD  azithromycin (ZITHROMAX) 250 MG tablet 2 po day 1 followed by one po days 2-5 10/30/16   Elby Showers, MD  benzonatate (TESSALON) 100 MG capsule Take 1 capsule (100 mg total) by mouth 2 (two) times daily as needed for cough. 10/30/16   Elby Showers, MD  CRESTOR 10 MG tablet TAKE 1 TABLET BY MOUTH  DAILY Patient not taking: Reported on  10/30/2016 08/06/16   Elby Showers, MD  fluticasone [facility]) 50 MCG/ACT nasal spray Use 2 sprays in each  nostril daily 01/29/16   Elby Showers, MD  hydrochlorothiazide (HYDRODIURIL) 25 MG tablet TAKE 1 TABLET BY MOUTH  DAILY 10/18/16   Elby Showers, MD  lisinopril (PRINIVIL,ZESTRIL) 20 MG tablet TAKE 1 TABLET BY MOUTH  DAILY 09/01/16   Elby Showers, MD  PROVENTIL HFA 108 [phone] [employer]) MCG/ACT inhaler INHALE 2 PUFFS INTO THE LUNGS EVERY 6 HOURS AS NEEDED FOR WHEEZING OR SHORTNESS OF BREATH 02/05/16   Elby Showers, MD  sildenafil (VIAGRA) 50 MG tablet Take 50 mg by mouth daily as needed.      Historical Provider, MD    Family History Family History  Problem Relation Age of Onset  . Cancer Mother   . Cancer Father     Social History Social History  Substance Use Topics  . Smoking status: Former Research scientist (life sciences)  . Smokeless tobacco: Never Used  . Alcohol use 0.0 oz/week    1 - 2 Glasses of wine per week     Allergies   Penicillins and Zocor [simvastatin]   Review of Systems Review of Systems  All other systems reviewed and are negative.    Physical Exam Updated Vital Signs BP 115/84 (BP Location: Right Arm)   Pulse 112  Temp 97.7 F (36.5 C) (Oral)   Resp 22   SpO2 93%   Physical Exam  Constitutional: He is oriented to person, place, and time. He appears well-developed and well-nourished.  HENT:  Head: Normocephalic and atraumatic.  Mouth/Throat: Oropharynx is clear and moist.  Cardiovascular: Regular rhythm.   No murmur heard. Tachycardic  Pulmonary/Chest: Effort normal. No stridor. No respiratory distress.  Tachypnea with decreased air movement bilaterally, occasional end expiratory wheezes. Frequent cough  Abdominal: Soft. There is no tenderness. There is no rebound and no guarding.  Musculoskeletal: He exhibits no edema or tenderness.  Neurological: He is alert and oriented to person, place, and time.  Skin: Skin is warm and dry.  Psychiatric: He has a normal mood and  affect. His behavior is normal.  Nursing note and vitals reviewed.    ED Treatments / Results  Labs (all labs ordered are listed, but only abnormal results are displayed) [place] DIFFERENTIAL/PLATELET  I-STAT [place], ED    EKG  EKG Interpretation None       Radiology No results found.  Procedures Procedures (including critical care time)  Medications Ordered in ED Medications  methylPREDNISolone sodium succinate (SOLU-MEDROL) 125 mg/2 mL injection 125 mg (not administered)  ipratropium-albuterol (DUONEB) 0.5-2.5 (3) MG/3ML nebulizer solution 3 mL (not administered)     Initial Impression / Assessment and Plan / ED Course  I have reviewed the triage vital signs and the nursing notes.  Pertinent labs & imaging results that were available during my care of the patient were reviewed by me and considered in my medical decision making (see chart for details).  Clinical Course     Pt here for evaluation of severe cough after initiating tessalon.  Cough and wheeze improved in ED on re-assessment following steroids, albuterol.  Current clinical picture is not c/w anaphylaxis, ACS, CHF, PE.  Concern for bronchospasm related to tessalon use that has improved during ED observation.  Plan to d/c home with short course of steroids.  Patient can use his home albuterol q4hrs prn.  Discussed discontinuing tessalon and to avoid this drug in the future.  Outpatient follow up and return precautions discussed.    Final Clinical Impressions(s) / ED Diagnoses   Final diagnoses:  None    New Prescriptions New Prescriptions   No medications on file     Quintella Reichert, MD 10/31/16 1022

## 2016-10-30 NOTE — Telephone Encounter (Signed)
Patient called complaining of a dry cough for the past ten days. Stated that he had a similar issue before and was prescribed a cough syrup containing Codeine. He was wondering if he could get that again. He uses the [facility] on [address]. Please advise.

## 2016-11-28 ENCOUNTER — Other Ambulatory Visit: Payer: Self-pay

## 2016-11-28 ENCOUNTER — Encounter: Payer: Self-pay | Admitting: Internal Medicine

## 2016-11-28 MED ORDER — HYDROCHLOROTHIAZIDE 25 MG PO TABS: 25.0000 mg | ORAL_TABLET | Freq: Every day | ORAL | 1 refills | Status: DC

## 2016-12-19 ENCOUNTER — Encounter: Payer: Self-pay | Admitting: Internal Medicine

## 2016-12-19 ENCOUNTER — Ambulatory Visit (INDEPENDENT_AMBULATORY_CARE_PROVIDER_SITE_OTHER): Payer: Medicare Other | Admitting: Internal Medicine

## 2016-12-19 VITALS — BP 122/70 | HR 73 | Temp 98.6°F | Ht 66.0 in | Wt 185.0 lb

## 2016-12-19 DIAGNOSIS — M26629 Arthralgia of temporomandibular joint, unspecified side: Secondary | ICD-10-CM | POA: Diagnosis not present

## 2016-12-19 MED ORDER — AMITRIPTYLINE HCL 10 MG PO TABS: 10.0000 mg | ORAL_TABLET | Freq: Every day | ORAL | 0 refills | Status: DC

## 2016-12-19 NOTE — Patient Instructions (Signed)
Elavil 10 mg at bedtime #30 prescribed. If symptoms do not improve please see dentist. Heart

## 2016-12-19 NOTE — Progress Notes (Signed)
   Subjective:    Patient ID: Andrew Cole, male    DOB: 05-09-1946, 71 y.o.   MRN: [id number]  HPI Patient had a respiratory infection on January 4 treated with Zithromax and Tessalon Perles. That evening he wound up in the emergency department with bronchospasm. 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 He had no tongue swelling or throat swelling. He had shortness of breath. He was thought to have had allergic reaction to [employer]. Was advised to use albuterol inhaler at home. He was given Solu-Medrol in the emergency department and a duo neb treatment.  Subsequently recently he had a flulike illness and went to urgent care and was prescribed Tamiflu.  A few years ago he had some issues with TMJ syndrome. A Andrew Cole nightguard for him and he still uses it. Recently has been experiencing some left jaw pain. He did not want to go back to the [facility] because apparently it would cost him out of pocket to do so. He feels that his bite is off on the left side. However this is not a new problem. He says Andrew Cole prescribed some Elavil for him to take which helped of. However this was several years ago.      Review of Systems     Objective:   Physical Exam Bite is uneven on the left. Denies significant pain with jaw opening on the left. May be mild tenderness at the left TM joint. Left TM is clear.       Assessment & Plan:  Left TMJ syndrome  Plan: Elavil 10 mg at bedtime #30 with no refill. If symptoms do not improve, he will need to go see dentist.

## 2016-12-30 ENCOUNTER — Telehealth: Payer: Self-pay | Admitting: Internal Medicine

## 2016-12-30 MED ORDER — CYCLOBENZAPRINE HCL 10 MG PO TABS: 10.0000 mg | ORAL_TABLET | Freq: Every day | ORAL | 0 refills | Status: DC

## 2016-12-30 NOTE — Telephone Encounter (Signed)
Patient has been taking Amitriptylin 10mg  for two weeks and there has not been any change.  He want's to know if something else can be called into [facility] on [address].

## 2016-12-30 NOTE — Telephone Encounter (Signed)
pt aware. Pt was notified to stop amytriptyline, pt verbalized understanding.

## 2016-12-30 NOTE — Telephone Encounter (Signed)
Call in Flexeril 10 mg hs  #30 with no refill. Pt is to stop Amitriptyline.

## 2017-02-26 ENCOUNTER — Other Ambulatory Visit: Payer: Self-pay | Admitting: Internal Medicine

## 2017-04-16 ENCOUNTER — Other Ambulatory Visit: Payer: Self-pay | Admitting: Internal Medicine

## 2017-04-16 NOTE — Telephone Encounter (Signed)
Refill  Through October

## 2017-04-16 NOTE — Telephone Encounter (Signed)
Refill through October- has appt then

## 2017-05-05 ENCOUNTER — Ambulatory Visit
Admission: RE | Admit: 2017-05-05 | Discharge: 2017-05-05 | Disposition: A | Discharge status: Home / Self Care | Payer: Medicare Other | Source: Ambulatory Visit | Attending: Internal Medicine | Admitting: Internal Medicine

## 2017-05-05 ENCOUNTER — Ambulatory Visit (INDEPENDENT_AMBULATORY_CARE_PROVIDER_SITE_OTHER): Payer: Medicare Other | Admitting: Internal Medicine

## 2017-05-05 ENCOUNTER — Encounter: Payer: Self-pay | Admitting: Internal Medicine

## 2017-05-05 VITALS — BP 142/90 | HR 77 | Temp 98.4°F | Wt 187.0 lb

## 2017-05-05 DIAGNOSIS — M26629 Arthralgia of temporomandibular joint, unspecified side: Secondary | ICD-10-CM

## 2017-05-05 DIAGNOSIS — M542 Cervicalgia: Secondary | ICD-10-CM | POA: Diagnosis not present

## 2017-05-05 DIAGNOSIS — M545 Low back pain, unspecified: Secondary | ICD-10-CM

## 2017-05-05 DIAGNOSIS — I10 Essential (primary) hypertension: Secondary | ICD-10-CM | POA: Diagnosis not present

## 2017-05-05 DIAGNOSIS — E7800 Pure hypercholesterolemia, unspecified: Secondary | ICD-10-CM

## 2017-05-05 MED ORDER — LOSARTAN POTASSIUM 50 MG PO TABS: 50.0000 mg | ORAL_TABLET | Freq: Every day | ORAL | 3 refills | Status: DC

## 2017-05-05 NOTE — Progress Notes (Signed)
   Subjective:    Patient ID: Andrew Cole, male    DOB: 10-Jan-1946, 71 y.o.   MRN: [id number]  HPI Persistent TMJ symptoms. Painful for first hour after awakening.  Has bite guard but is not helping. Chiropractor has told him to stop wearing it.Chiropractor wants him to have cervical neck x-ray  Has right anterior thigh pain and sees chiropractor. Chiropractor wants him to have lumbar spine films. Apparently chiropractor no longer has x-ray machine.  Patient says that 4 nights a week he will have some pain around 4 AM in his right anterior thigh that is helped a little bit with Flexeril. Tends to change position and thinks that helps. Patient does not think this is hip related pain.  With regard to hypertension, he stopped taking the sun up real because he says he developed a cough while on it. He did not let us know he had discontinued it and his blood pressure is elevated today.  Dentist also told him he had moderate sleep apnea after wearing a device overnight. We talked about pulmonary referral to have evaluation. He will consider it.  He stopped taking Crestor thinking it was causing musculoskeletal pain. Says he will come in in the near future for fasting lipid panel.    Review of Systems see above     Objective:   Physical Exam Chest clear. Cardiac exam regular rate and rhythm normal S1 and S2.       Assessment & Plan:  Long-standing history of TMJ-chiropractor wants him to have C-spine films  Right anterior thigh pain-to get lumbar spine films  History of hyperlipidemia-quit taking Crestor and wants his cholesterol checked off lipid medication. To have lipid panel later this week  ? Sleep apnea he will consider pulmonary referral  Hypertension-Add losartan 50 mg daily to hydrodiuril 25 mg daily. He will need follow-up appointment in 2-3 weeks. He may need  a higher dose of losartan given that he was on lisinopril 20 mg daily. Lisinopril will be discontinued.

## 2017-05-05 NOTE — Patient Instructions (Signed)
Discontinue lisinopril. Add losartan 50 mg daily to diuretic. Call with blood pressure results in 3 weeks. Have arranged for him to have C-spine and LS-spine films as recommended by chiropractor. Consider pulmonary referral for sleep apnea evaluation

## 2017-05-07 ENCOUNTER — Other Ambulatory Visit: Payer: Medicare Other | Admitting: Internal Medicine

## 2017-05-07 DIAGNOSIS — E785 Hyperlipidemia, unspecified: Secondary | ICD-10-CM

## 2017-05-07 DIAGNOSIS — I10 Essential (primary) hypertension: Secondary | ICD-10-CM

## 2017-05-07 LAB — HEPATIC FUNCTION PANEL
ALK PHOS: 54 U/L (ref 40–115)
ALT: 21 U/L (ref 9–46)
AST: 19 U/L (ref 10–35)
Albumin: 4.5 g/dL (ref 3.6–5.1)
BILIRUBIN INDIRECT: 0.4 mg/dL (ref 0.2–1.2)
Bilirubin, Direct: 0.1 mg/dL (ref <=0.2)
TOTAL PROTEIN: 7 g/dL (ref 6.1–8.1)
Total Bilirubin: 0.5 mg/dL (ref 0.2–1.2)

## 2017-05-07 LAB — LIPID PANEL
CHOLESTEROL: 284 mg/dL — AB (ref <200)
HDL: 84 mg/dL (ref >40)
LDL CALC: 180 mg/dL — AB (ref <100)
TRIGLYCERIDES: 99 mg/dL (ref <150)
Total CHOL/HDL Ratio: 3.4 Ratio (ref <5.0)
VLDL: 20 mg/dL (ref <30)

## 2017-06-02 ENCOUNTER — Ambulatory Visit: Payer: Medicare Other | Admitting: Internal Medicine

## 2017-06-04 ENCOUNTER — Ambulatory Visit (INDEPENDENT_AMBULATORY_CARE_PROVIDER_SITE_OTHER): Payer: Medicare Other | Admitting: Internal Medicine

## 2017-06-04 ENCOUNTER — Encounter: Payer: Self-pay | Admitting: Internal Medicine

## 2017-06-04 VITALS — BP 124/62 | HR 100 | Temp 98.5°F | Wt 189.0 lb

## 2017-06-04 DIAGNOSIS — I10 Essential (primary) hypertension: Secondary | ICD-10-CM

## 2017-06-04 MED ORDER — LOSARTAN POTASSIUM 100 MG PO TABS: 100.0000 mg | ORAL_TABLET | Freq: Every day | ORAL | 3 refills | Status: DC

## 2017-06-04 NOTE — Patient Instructions (Signed)
Increase losartan 100 mg daily and return in mid September

## 2017-06-04 NOTE — Progress Notes (Signed)
   Subjective:    Patient ID: Andrew Cole, male    DOB: 02/03/1946, 71 y.o.   MRN: [id number]  HPI 71 year old Male for follow up HTN. TMJ justs bothers him early am. Leg pain seems positional in bed.  With regard to hypertension, he bought home BP monitor and BP Readings from home revealed systolic pressures 859-923 and diastolic pressures  BP 41-44. He is currently on losartan 50 mg daily.  He is planning a trip to [facility] and won't be back until September 13.      Review of Systems see above no new complaints     Objective:   Physical Exam  Blood pressure right arm regular cuff 140/90. Left arm regular cuff 130/80.      Assessment & Plan:  Essential hypertension  Plan: Increase losartan to 100 mg daily and follow-up mid September. At that time he'll need a basic metabolic panel and office visit. He says he would like to bring his home blood pressure cuff at that time to see if we think it is registering high.

## 2017-06-18 ENCOUNTER — Telehealth: Payer: Self-pay | Admitting: Internal Medicine

## 2017-06-18 MED ORDER — ROSUVASTATIN CALCIUM 10 MG PO TABS: 10.0000 mg | ORAL_TABLET | Freq: Every day | ORAL | 0 refills | Status: DC

## 2017-06-18 NOTE — Telephone Encounter (Signed)
Call in 90 day generic crestor 10 mg one po daily to [facility] with one refill

## 2017-06-18 NOTE — Telephone Encounter (Signed)
Patient has been getting his Crestor 10mg  from [facility] order pharmacy.  They have contacted him and stated that they recommend he consider an alternative of Rouvastatin 10mg  that would be a much cheaper alternative.  He wants to know if you would prescribe that for him.  He is in need of a refill and doesn't want to wait for the refill from [facility].  Wants to know if you will sent to [facility].  He also would like a 90 day because that is cheaper.    Pharmacy:  [facility] [address]  Best # for contact for patient:  [phone].  Thank you.

## 2017-06-18 NOTE — Telephone Encounter (Signed)
Escribed

## 2017-07-14 ENCOUNTER — Encounter: Payer: Self-pay | Admitting: Internal Medicine

## 2017-07-14 ENCOUNTER — Ambulatory Visit (INDEPENDENT_AMBULATORY_CARE_PROVIDER_SITE_OTHER): Payer: Medicare Other | Admitting: Internal Medicine

## 2017-07-14 VITALS — BP 130/80 | HR 83 | Temp 98.3°F | Wt 187.0 lb

## 2017-07-14 DIAGNOSIS — Z23 Encounter for immunization: Secondary | ICD-10-CM | POA: Diagnosis not present

## 2017-07-14 DIAGNOSIS — I10 Essential (primary) hypertension: Secondary | ICD-10-CM | POA: Diagnosis not present

## 2017-07-14 LAB — BASIC METABOLIC PANEL WITH GFR
BUN: 21 mg/dL (ref 7–25)
CO2: 24 mmol/L (ref 20–32)
CREATININE: 1 mg/dL (ref 0.70–1.18)
Calcium: 9.8 mg/dL (ref 8.6–10.3)
Chloride: 104 mmol/L (ref 98–110)
GFR, EST AFRICAN AMERICAN: 88 mL/min/{1.73_m2} (ref >=60)
GFR, Est Non African American: 76 mL/min/{1.73_m2} (ref >=60)
Glucose, Bld: 112 mg/dL (ref 65–139)
Potassium: 4 mmol/L (ref 3.5–5.3)
Sodium: 139 mmol/L (ref 135–146)

## 2017-07-14 LAB — EXTRA LAV TOP TUBE

## 2017-07-14 MED ORDER — LOSARTAN POTASSIUM-HCTZ 100-25 MG PO TABS: 1.0000 | ORAL_TABLET | Freq: Every day | ORAL | 0 refills | Status: DC

## 2017-07-14 MED ORDER — AMLODIPINE BESYLATE 5 MG PO TABS: 5.0000 mg | ORAL_TABLET | Freq: Every day | ORAL | 3 refills | Status: DC

## 2017-07-14 NOTE — Patient Instructions (Signed)
Losartan HCTZ will be combined as 1 pill 100/25 daily. Norvasc 5 mg daily added. Patient will wait to follow-up at time of his physical October 22. Flu vaccine given.

## 2017-07-14 NOTE — Progress Notes (Signed)
   Subjective:    Patient ID: Andrew Cole, male    DOB: 06-09-1946, 71 y.o.   MRN: [id number]  HPI  71 year old Male with HTN In today for follow-up on hypertension. He brings in his blood pressure device from home. Says it's running some 10 points higher than what we are getting here. Initially when he arrived blood pressure was 142/80 but after sitting for a few minutes it was 130/80. He says that losartan is causing some diarrhea so he takes it at 2:00 in the afternoon. He has multiple blood pressure readings and feels his blood pressure could be better controlled. He is willing to try losartan HCTZ as a combination medication.   Review of Systems See above    Objective:   Physical Exam Skin warm and dry. Nodes none. Neck is supple without JVD thyromegaly or carotid bruits. Chest clear. Cardiac exam regular rate and rhythm. Extremities without edema       Assessment & Plan:  Essential hypertension-aiming for better control. Adding Norvasc 5 mg daily to losartan HCTZ 100/25 daily. He will return October 22 for his physical examination. He did not want to come sooner. He will have fasting labs prior to his physical exam.  Flu vaccine given

## 2017-07-26 ENCOUNTER — Other Ambulatory Visit: Payer: Self-pay | Admitting: Internal Medicine

## 2017-08-01 ENCOUNTER — Other Ambulatory Visit: Payer: Self-pay | Admitting: Internal Medicine

## 2017-08-17 ENCOUNTER — Other Ambulatory Visit (INDEPENDENT_AMBULATORY_CARE_PROVIDER_SITE_OTHER): Payer: Medicare Other | Admitting: Internal Medicine

## 2017-08-17 DIAGNOSIS — R972 Elevated prostate specific antigen [PSA]: Secondary | ICD-10-CM

## 2017-08-17 DIAGNOSIS — Z0000 Encounter for general adult medical examination without abnormal findings: Secondary | ICD-10-CM

## 2017-08-17 DIAGNOSIS — F419 Anxiety disorder, unspecified: Secondary | ICD-10-CM

## 2017-08-17 DIAGNOSIS — I10 Essential (primary) hypertension: Secondary | ICD-10-CM

## 2017-08-17 DIAGNOSIS — J309 Allergic rhinitis, unspecified: Secondary | ICD-10-CM

## 2017-08-17 DIAGNOSIS — E785 Hyperlipidemia, unspecified: Secondary | ICD-10-CM

## 2017-08-17 LAB — CBC WITH DIFFERENTIAL/PLATELET
BASOS ABS: 30 {cells}/uL (ref 0–200)
Basophils Relative: 0.5 %
EOS ABS: 207 {cells}/uL (ref 15–500)
Eosinophils Relative: 3.5 %
HEMATOCRIT: 42.5 % (ref 38.5–50.0)
HEMOGLOBIN: 14.4 g/dL (ref 13.2–17.1)
Lymphs Abs: 2425 cells/uL (ref 850–3900)
MCH: 32.7 pg (ref 27.0–33.0)
MCHC: 33.9 g/dL (ref 32.0–36.0)
MCV: 96.4 fL (ref 80.0–100.0)
MONOS PCT: 13 %
MPV: 9.4 fL (ref 7.5–12.5)
NEUTROS ABS: 2472 {cells}/uL (ref 1500–7800)
NEUTROS PCT: 41.9 %
Platelets: 202 10*3/uL (ref 140–400)
RBC: 4.41 10*6/uL (ref 4.20–5.80)
RDW: 12.4 % (ref 11.0–15.0)
Total Lymphocyte: 41.1 %
WBC mixed population: 767 cells/uL (ref 200–950)
WBC: 5.9 10*3/uL (ref 3.8–10.8)

## 2017-08-17 LAB — PSA: PSA: 1.3 ng/mL (ref <=4.0)

## 2017-08-17 LAB — LIPID PANEL
CHOLESTEROL: 201 mg/dL — AB (ref <200)
HDL: 96 mg/dL (ref >40)
LDL Cholesterol (Calc): 89 mg/dL (calc)
Non-HDL Cholesterol (Calc): 105 mg/dL (calc) (ref <130)
Total CHOL/HDL Ratio: 2.1 (calc) (ref <5.0)
Triglycerides: 72 mg/dL (ref <150)

## 2017-08-17 LAB — COMPLETE METABOLIC PANEL WITH GFR
AG Ratio: 2 (calc) (ref 1.0–2.5)
ALBUMIN MSPROF: 4.6 g/dL (ref 3.6–5.1)
ALKALINE PHOSPHATASE (APISO): 50 U/L (ref 40–115)
ALT: 29 U/L (ref 9–46)
AST: 19 U/L (ref 10–35)
BUN: 22 mg/dL (ref 7–25)
CHLORIDE: 101 mmol/L (ref 98–110)
CO2: 28 mmol/L (ref 20–32)
Calcium: 9.8 mg/dL (ref 8.6–10.3)
Creat: 1.03 mg/dL (ref 0.70–1.18)
GFR, Est African American: 85 mL/min/{1.73_m2} (ref >=60)
GFR, Est Non African American: 73 mL/min/{1.73_m2} (ref >=60)
GLOBULIN: 2.3 g/dL (ref 1.9–3.7)
GLUCOSE: 88 mg/dL (ref 65–99)
Potassium: 4.2 mmol/L (ref 3.5–5.3)
SODIUM: 139 mmol/L (ref 135–146)
Total Bilirubin: 0.5 mg/dL (ref 0.2–1.2)
Total Protein: 6.9 g/dL (ref 6.1–8.1)

## 2017-08-24 ENCOUNTER — Ambulatory Visit (INDEPENDENT_AMBULATORY_CARE_PROVIDER_SITE_OTHER): Payer: Medicare Other | Admitting: Internal Medicine

## 2017-08-24 ENCOUNTER — Encounter: Payer: Self-pay | Admitting: Internal Medicine

## 2017-08-24 VITALS — BP 120/76 | HR 79 | Temp 97.8°F | Ht 65.75 in | Wt 185.0 lb

## 2017-08-24 DIAGNOSIS — Z0000 Encounter for general adult medical examination without abnormal findings: Secondary | ICD-10-CM

## 2017-08-24 DIAGNOSIS — E7800 Pure hypercholesterolemia, unspecified: Secondary | ICD-10-CM | POA: Diagnosis not present

## 2017-08-24 DIAGNOSIS — M26609 Unspecified temporomandibular joint disorder, unspecified side: Secondary | ICD-10-CM | POA: Diagnosis not present

## 2017-08-24 DIAGNOSIS — F419 Anxiety disorder, unspecified: Secondary | ICD-10-CM | POA: Diagnosis not present

## 2017-08-24 DIAGNOSIS — I10 Essential (primary) hypertension: Secondary | ICD-10-CM | POA: Diagnosis not present

## 2017-08-24 LAB — POCT URINALYSIS DIPSTICK
Bilirubin, UA: NEGATIVE
Blood, UA: NEGATIVE
Glucose, UA: NEGATIVE
Ketones, UA: NEGATIVE
LEUKOCYTES UA: NEGATIVE
NITRITE UA: NEGATIVE
PROTEIN UA: NEGATIVE
Spec Grav, UA: 1.02 (ref 1.010–1.025)
UROBILINOGEN UA: 0.2 U/dL
pH, UA: 5 (ref 5.0–8.0)

## 2017-08-24 NOTE — Patient Instructions (Addendum)
It was a pleasure to see you today.  Continue current medical concerns including lipid-lowering medication and return in 1 year or as needed.  Prescription given for Shingrix vaccine.  Take Flexeril and anti-inflammatory medication for TMJ.

## 2017-08-24 NOTE — Progress Notes (Signed)
Subjective:    Patient ID: Andrew Cole, male    DOB: [birth year], 71 y.o.   MRN: [id number]  HPI 71 year old male for health maintenance exam and evaluation of medical issues.  History of hypertension, hyperlipidemia, allergic rhinitis, anxiety.  History of adenomatous colon polyps.  Colonoscopy in 2008 by Dr. Sharlett Iles.  Intolerant of Zocor causes myalgias but Crestor is tolerated.  Social history: Married.  2 adult sons.  Does not smoke.  Used to smoke in the remote past.  Social alcohol consumption.  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  Family history: Mother died with metastatic ovarian cancer.  Father with history of colon polyps and hyperlipidemia.  One brother and 2 sisters.  Rx given for Shingrix vaccine.  Still with TMJ issues.  Takes muscle relaxant.  Would benefit from taking anti-inflammatory medication every day not just as needed.  Labs normal including lipid panel now that he is back on lipid medication.  BP excellent on current regimen.    Review of Systems  HENT:       TMJ pain  Respiratory: Negative.   Cardiovascular: Negative.   Gastrointestinal: Negative.   Genitourinary: Negative.   Musculoskeletal:       Low back pain for which she is seeing chiropractor  Neurological: Negative.   Hematological: Negative.        Objective:   Physical Exam  Constitutional: He is oriented to person, place, and time. He appears well-developed and well-nourished. No distress.  HENT:  Head: Normocephalic and atraumatic.  Right Ear: External ear normal.  Left Ear: External ear normal.  Mouth/Throat: Oropharynx is clear and moist.  Eyes: Pupils are equal, round, and reactive to light. Conjunctivae and EOM are normal. Right eye exhibits no discharge. Left eye exhibits no discharge. No scleral icterus.  Neck: Neck supple. No JVD present. No thyromegaly present.  Cardiovascular: Normal rate, normal heart sounds and intact distal pulses.   No murmur  heard. Pulmonary/Chest: Effort normal and breath sounds normal. No respiratory distress. He has no wheezes.  Abdominal: Soft. Bowel sounds are normal. He exhibits no distension and no mass. There is no tenderness. There is no rebound and no guarding.  Genitourinary: Prostate normal.  Musculoskeletal: He exhibits no edema.  Lymphadenopathy:    He has no cervical adenopathy.  Neurological: He is alert and oriented to person, place, and time. He has normal reflexes. No cranial nerve deficit.  Skin: Skin is warm and dry. No rash noted. He is not diaphoretic.  Psychiatric: He has a normal mood and affect. His behavior is normal. Judgment and thought content normal.  Vitals reviewed.         Assessment & Plan:  Essential hypertension-stable on current regimen  Hyperlipidemia-stable with lipid lowering medication and he agrees to continue.  He thought it might be causing musculoskeletal pain but found that it did not  Health maintenance-prescription given for Shingrix vaccine  Recommend diet exercise and a little bit of weight loss.  BMI is 30.  TMJ syndrome-recommend anti-inflammatory medication with muscle relaxant on a daily basis  History of BPH but only has to get up once a night to urinate.  History of erectile dysfunction  History of anxiety for which he takes as needed Xanax stable  Return in 1 year or as needed.  Subjective:   Patient presents for Medicare Annual/Subsequent preventive examination.  Review Past Medical/Family/Social: See above   Risk Factors  Current exercise habits: Active with outside activities Dietary issues  discussed: Low-fat low-carb  Cardiac risk factors: Hyperlipidemia  Depression Screen  (Note: if answer to either of the following is "Yes", a more complete depression screening is indicated)   Over the past two weeks, have you felt down, depressed or hopeless? No  Over the past two weeks, have you felt little interest or pleasure in doing  things? No Have you lost interest or pleasure in daily life? No Do you often feel hopeless? No Do you cry easily over simple problems? No   Activities of Daily Living  In your present state of health, do you have any difficulty performing the following activities?:   Driving? No  Managing money? No  Feeding yourself? No  Getting from bed to chair? No  Climbing a flight of stairs? No  Preparing food and eating?: No  Bathing or showering? No  Getting dressed: No  Getting to the toilet? No  Using the toilet:No  Moving around from place to place: No  In the past year have you fallen or had a near fall?:No  Are you sexually active? yes Do you have more than one partner? No   Hearing Difficulties: No  Do you often ask people to speak up or repeat themselves? No  Do you experience ringing or noises in your ears? No  Do you have difficulty understanding soft or whispered voices? yes Do you feel that you have a problem with memory? No Do you often misplace items? No    Home Safety:  Do you have a smoke alarm at your residence? Yes Do you have grab bars in the bathroom?  No Do you have throw rugs in your house?  No   Cognitive Testing  Alert? Yes Normal Appearance?Yes  Oriented to person? Yes Place? Yes  Time? Yes  Recall of three objects? Yes  Can perform simple calculations? Yes  Displays appropriate judgment?Yes  Can read the correct time from a watch face?Yes   List the Names of Other Physician/Practitioners you currently use:  See referral list for the physicians patient is currently seeing.     Review of Systems: See above   Objective:     General appearance: Appears stated age  Head: Normocephalic, without obvious abnormality, atraumatic  Eyes: conj clear, EOMi PEERLA  Ears: normal TM's and external ear canals both ears  Nose: Nares normal. Septum midline. Mucosa normal. No drainage or sinus tenderness.  Throat: lips, mucosa, and tongue normal; teeth and  gums normal  Neck: no adenopathy, no carotid bruit, no JVD, supple, symmetrical, trachea midline and thyroid not enlarged, symmetric, no tenderness/mass/nodules  No CVA tenderness.  Lungs: clear to auscultation bilaterally  Breasts: normal appearance, no masses or tenderness Heart: regular rate and rhythm, S1, S2 normal, no murmur, click, rub or gallop  Abdomen: soft, non-tender; bowel sounds normal; no masses, no organomegaly  Musculoskeletal: ROM normal in all joints, no crepitus, no deformity, Normal muscle strengthen. Back  is symmetric, no curvature. Skin: Skin color, texture, turgor normal. No rashes or lesions  Lymph nodes: Cervical, supraclavicular, and axillary nodes normal.  Neurologic: CN 2 -12 Normal, Normal symmetric reflexes. Normal coordination and gait  Psych: Alert & Oriented x 3, Mood appear stable.    Assessment:    Annual wellness medicare exam   Plan:    During the course of the visit the patient was educated and counseled about appropriate screening and preventive services including:   Annual PSA is normal  Has had flu vaccine  Prescription for shingles vaccine  Pneumonia vaccines are up-to-date.  Tetanus immunization is up-to-date       Patient Instructions (the written plan) was given to the patient.  Medicare Attestation  I have personally reviewed:  The patient's medical and social history  Their use of alcohol, tobacco or illicit drugs  Their current medications and supplements  The patient's functional ability including ADLs,fall risks, home safety risks, cognitive, and hearing and visual impairment  Diet and physical activities  Evidence for depression or mood disorders  The patient's weight, height, BMI, and visual acuity have been recorded in the chart. I have made referrals, counseling, and provided education to the patient based on review of the above and I have provided the patient with a written personalized care plan for preventive  services.

## 2017-09-28 NOTE — Telephone Encounter (Signed)
Done

## 2017-09-28 NOTE — Telephone Encounter (Signed)
Last one...controlled substance.  Thanks.

## 2017-10-12 ENCOUNTER — Other Ambulatory Visit: Payer: Self-pay | Admitting: Internal Medicine

## 2017-11-09 ENCOUNTER — Other Ambulatory Visit: Payer: Self-pay | Admitting: Internal Medicine

## 2017-11-13 ENCOUNTER — Telehealth: Payer: Self-pay | Admitting: Internal Medicine

## 2017-11-13 NOTE — Telephone Encounter (Signed)
Ok but make sure he has follow up appt

## 2017-11-13 NOTE — Telephone Encounter (Signed)
Patient calling to request a change in his Rx from 30 day to 90 day on Amlodipine 5mg  and Losartan-HCTZ 100-25 mg.   Pharmacy:  [facility]  Best # for contact:  [phone]  Thank you.

## 2017-11-16 MED ORDER — AMLODIPINE BESYLATE 5 MG PO TABS: 5.0000 mg | ORAL_TABLET | Freq: Every day | ORAL | 2 refills | Status: DC

## 2017-11-16 MED ORDER — LOSARTAN POTASSIUM-HCTZ 100-25 MG PO TABS: 1.0000 | ORAL_TABLET | Freq: Every day | ORAL | 2 refills | Status: DC

## 2017-11-16 NOTE — Telephone Encounter (Signed)
Spoke with patient he said he's doing good on his BP meds. Refills sent.

## 2017-11-16 NOTE — Addendum Note (Signed)
Addended by: Mady Haagensen on: 11/16/2017 12:31 PM   Modules accepted: Orders

## 2017-11-16 NOTE — Telephone Encounter (Signed)
He was seen on 08/24/17 for his CPE.  He has only been coming once a year for his CPE.  Your note says return in 1 year and that he is stable on his current regime regarding hypertension.    Is it ok to wait for his normal return in October??

## 2017-11-16 NOTE — Telephone Encounter (Signed)
Yes as long as BP is doing OK and he has no further issues. Note October 2018 CPE says one year.

## 2017-11-17 ENCOUNTER — Other Ambulatory Visit: Payer: Self-pay

## 2017-11-17 MED ORDER — LOSARTAN POTASSIUM-HCTZ 100-25 MG PO TABS: 1.0000 | ORAL_TABLET | Freq: Every day | ORAL | 2 refills | Status: DC

## 2018-01-21 ENCOUNTER — Other Ambulatory Visit: Payer: Self-pay | Admitting: Internal Medicine

## 2018-01-21 NOTE — Telephone Encounter (Signed)
Refill x 6 months 

## 2018-04-12 ENCOUNTER — Other Ambulatory Visit: Payer: Self-pay | Admitting: Internal Medicine

## 2018-04-12 NOTE — Telephone Encounter (Signed)
Due for CPE in October. Please check book to see if he has appt . If not, make appt before refilling

## 2018-04-13 ENCOUNTER — Encounter: Payer: Self-pay | Admitting: Internal Medicine

## 2018-04-14 NOTE — Telephone Encounter (Signed)
Left message to call.

## 2018-07-21 ENCOUNTER — Other Ambulatory Visit: Payer: Self-pay | Admitting: Internal Medicine

## 2018-07-28 ENCOUNTER — Other Ambulatory Visit: Payer: Self-pay

## 2018-07-28 MED ORDER — ROSUVASTATIN CALCIUM 10 MG PO TABS: 10.0000 mg | ORAL_TABLET | Freq: Every day | ORAL | 0 refills | Status: DC

## 2018-08-10 ENCOUNTER — Other Ambulatory Visit: Payer: Self-pay | Admitting: Internal Medicine

## 2018-08-23 ENCOUNTER — Other Ambulatory Visit: Payer: Medicare Other | Admitting: Internal Medicine

## 2018-08-26 ENCOUNTER — Encounter: Payer: Medicare Other | Admitting: Internal Medicine

## 2018-09-06 ENCOUNTER — Other Ambulatory Visit: Payer: Self-pay | Admitting: Internal Medicine

## 2018-09-06 DIAGNOSIS — I10 Essential (primary) hypertension: Secondary | ICD-10-CM

## 2018-09-06 DIAGNOSIS — Z125 Encounter for screening for malignant neoplasm of prostate: Secondary | ICD-10-CM

## 2018-09-06 DIAGNOSIS — S0300XS Dislocation of jaw, unspecified side, sequela: Secondary | ICD-10-CM

## 2018-09-06 DIAGNOSIS — E7800 Pure hypercholesterolemia, unspecified: Secondary | ICD-10-CM

## 2018-09-06 DIAGNOSIS — F419 Anxiety disorder, unspecified: Secondary | ICD-10-CM

## 2018-09-06 DIAGNOSIS — Z0000 Encounter for general adult medical examination without abnormal findings: Secondary | ICD-10-CM

## 2018-09-07 ENCOUNTER — Other Ambulatory Visit: Payer: Medicare Other | Admitting: Internal Medicine

## 2018-09-07 DIAGNOSIS — I10 Essential (primary) hypertension: Secondary | ICD-10-CM

## 2018-09-07 DIAGNOSIS — Z0000 Encounter for general adult medical examination without abnormal findings: Secondary | ICD-10-CM

## 2018-09-07 DIAGNOSIS — F419 Anxiety disorder, unspecified: Secondary | ICD-10-CM

## 2018-09-07 DIAGNOSIS — S0300XS Dislocation of jaw, unspecified side, sequela: Secondary | ICD-10-CM

## 2018-09-07 DIAGNOSIS — E7800 Pure hypercholesterolemia, unspecified: Secondary | ICD-10-CM

## 2018-09-07 LAB — LIPID PANEL
Cholesterol: 183 mg/dL (ref <200)
HDL: 77 mg/dL (ref >40)
LDL Cholesterol (Calc): 92 mg/dL (calc)
NON-HDL CHOLESTEROL (CALC): 106 mg/dL (ref <130)
Total CHOL/HDL Ratio: 2.4 (calc) (ref <5.0)
Triglycerides: 57 mg/dL (ref <150)

## 2018-09-07 LAB — COMPLETE METABOLIC PANEL WITH GFR
AG Ratio: 1.9 (calc) (ref 1.0–2.5)
ALBUMIN MSPROF: 4.4 g/dL (ref 3.6–5.1)
ALT: 32 U/L (ref 9–46)
AST: 21 U/L (ref 10–35)
Alkaline phosphatase (APISO): 46 U/L (ref 40–115)
BUN: 24 mg/dL (ref 7–25)
CALCIUM: 9.9 mg/dL (ref 8.6–10.3)
CO2: 27 mmol/L (ref 20–32)
Chloride: 103 mmol/L (ref 98–110)
Creat: 1.05 mg/dL (ref 0.70–1.18)
GFR, EST AFRICAN AMERICAN: 82 mL/min/{1.73_m2} (ref >=60)
GFR, EST NON AFRICAN AMERICAN: 71 mL/min/{1.73_m2} (ref >=60)
Globulin: 2.3 g/dL (calc) (ref 1.9–3.7)
Glucose, Bld: 96 mg/dL (ref 65–99)
POTASSIUM: 4 mmol/L (ref 3.5–5.3)
Sodium: 139 mmol/L (ref 135–146)
TOTAL PROTEIN: 6.7 g/dL (ref 6.1–8.1)
Total Bilirubin: 0.5 mg/dL (ref 0.2–1.2)

## 2018-09-07 LAB — CBC WITH DIFFERENTIAL/PLATELET
BASOS ABS: 18 {cells}/uL (ref 0–200)
Basophils Relative: 0.3 %
EOS ABS: 201 {cells}/uL (ref 15–500)
Eosinophils Relative: 3.3 %
HEMATOCRIT: 40 % (ref 38.5–50.0)
HEMOGLOBIN: 14.1 g/dL (ref 13.2–17.1)
LYMPHS ABS: 2440 {cells}/uL (ref 850–3900)
MCH: 33.4 pg — AB (ref 27.0–33.0)
MCHC: 35.3 g/dL (ref 32.0–36.0)
MCV: 94.8 fL (ref 80.0–100.0)
MPV: 9.5 fL (ref 7.5–12.5)
Monocytes Relative: 13.8 %
NEUTROS ABS: 2599 {cells}/uL (ref 1500–7800)
Neutrophils Relative %: 42.6 %
Platelets: 225 10*3/uL (ref 140–400)
RBC: 4.22 10*6/uL (ref 4.20–5.80)
RDW: 13 % (ref 11.0–15.0)
Total Lymphocyte: 40 %
WBC mixed population: 842 cells/uL (ref 200–950)
WBC: 6.1 10*3/uL (ref 3.8–10.8)

## 2018-09-07 LAB — PSA: PSA: 1.6 ng/mL (ref <=4.0)

## 2018-09-10 ENCOUNTER — Ambulatory Visit (INDEPENDENT_AMBULATORY_CARE_PROVIDER_SITE_OTHER): Payer: Medicare Other | Admitting: Internal Medicine

## 2018-09-10 ENCOUNTER — Encounter: Payer: Self-pay | Admitting: Internal Medicine

## 2018-09-10 VITALS — BP 100/60 | HR 82 | Ht 68.0 in | Wt 195.0 lb

## 2018-09-10 DIAGNOSIS — Z86010 Personal history of colonic polyps: Secondary | ICD-10-CM

## 2018-09-10 DIAGNOSIS — I10 Essential (primary) hypertension: Secondary | ICD-10-CM | POA: Diagnosis not present

## 2018-09-10 DIAGNOSIS — Z0000 Encounter for general adult medical examination without abnormal findings: Secondary | ICD-10-CM | POA: Diagnosis not present

## 2018-09-10 DIAGNOSIS — E7800 Pure hypercholesterolemia, unspecified: Secondary | ICD-10-CM

## 2018-09-10 DIAGNOSIS — M26609 Unspecified temporomandibular joint disorder, unspecified side: Secondary | ICD-10-CM

## 2018-09-10 DIAGNOSIS — F419 Anxiety disorder, unspecified: Secondary | ICD-10-CM

## 2018-09-10 DIAGNOSIS — Z8709 Personal history of other diseases of the respiratory system: Secondary | ICD-10-CM

## 2018-09-10 LAB — POCT URINALYSIS DIPSTICK
APPEARANCE: NORMAL
Bilirubin, UA: NEGATIVE
GLUCOSE UA: NEGATIVE
Ketones, UA: NEGATIVE
LEUKOCYTES UA: NEGATIVE
NITRITE UA: NEGATIVE
Odor: NORMAL
PH UA: 6.5 (ref 5.0–8.0)
PROTEIN UA: NEGATIVE
RBC UA: NEGATIVE
Spec Grav, UA: 1.01 (ref 1.010–1.025)
Urobilinogen, UA: 0.2 E.U./dL

## 2018-09-10 MED ORDER — ALPRAZOLAM 0.5 MG PO TABS: 0.5000 mg | ORAL_TABLET | Freq: Two times a day (BID) | ORAL | 1 refills | Status: DC | PRN

## 2018-09-10 NOTE — Progress Notes (Signed)
Subjective:    Patient ID: Andrew Cole, male    DOB: September 14, 1946, 72 y.o.   MRN: [id number]  HPI 72 year old male for health maintenance exam and evaluation of medical issues.  History of hypertension, hyperlipidemia, allergic rhinitis and anxiety.  History of adenomatous colon polyps.  Had colonoscopy by Dr. Earlean Shawl in 2016 with single polyp noted.  Had 16 polyps removed on colonoscopy in June 2019 by Dr. Earlean Shawl for 3-year recall.  Zocor causes myalgias but Crestor is tolerated.  Social history: Married.  2 adult sons.  Does not smoke.  Smoked in the remote past.  Social alcohol consumption.  A former Music therapist with masters degree.  Family history: Mother died with metastatic ovarian cancer.  Father with history of colon polyps and hyperlipidemia.  One brother and 2 sisters.    Review of Systems no new complaints.  Remote history of TMJ syndrome.     Objective:   Physical Exam  Constitutional: He is oriented to person, place, and time. He appears well-developed and well-nourished.  HENT:  Head: Normocephalic and atraumatic.  Right Ear: External ear normal.  Left Ear: External ear normal.  Nose: Nose normal.  Mouth/Throat: Oropharynx is clear and moist. No oropharyngeal exudate.  Eyes: Pupils are equal, round, and reactive to light. Conjunctivae and EOM are normal. Right eye exhibits no discharge. Left eye exhibits no discharge.  Neck: No JVD present. No thyromegaly present.  Cardiovascular: Normal rate, regular rhythm and normal heart sounds.  No murmur heard. Pulmonary/Chest: Effort normal. No respiratory distress. He has no wheezes. He has no rales.  Abdominal: Soft. Bowel sounds are normal. He exhibits no distension and no mass. There is no tenderness. There is no guarding.  Genitourinary: Prostate normal.  Musculoskeletal: He exhibits no edema.  Lymphadenopathy:    He has no cervical adenopathy.  Neurological: He is alert and oriented to person, place, and time.  Skin:  Skin is warm and dry.  Psychiatric: He has a normal mood and affect. His behavior is normal. Judgment and thought content normal.  Vitals reviewed.         Assessment & Plan:  Essential hypertension-stable on current regimen of losartan HCTZ and amlodipine  Allergic rhinitis  History of anxiety treated with benzodiazepine medication  Hyperlipidemia treated with Crestor 10 mg daily and lipids within normal limits  History of TMJ syndrome  Adenomatous colon polyps followed by Dr. Earlean Shawl  Plan: Return in 1 year or as needed.  Subjective:   Patient presents for Medicare Annual/Subsequent preventive examination.  Review Past Medical/Family/Social: See above   Risk Factors  Current exercise habits: Physically active with chores Dietary issues discussed: Low-fat low carbohydrate  Cardiac risk factors: Hyperlipidemia  Depression Screen  (Note: if answer to either of the following is "Yes", a more complete depression screening is indicated)   Over the past two weeks, have you felt down, depressed or hopeless? No  Over the past two weeks, have you felt little interest or pleasure in doing things? No Have you lost interest or pleasure in daily life? No Do you often feel hopeless? No Do you cry easily over simple problems? No   Activities of Daily Living  In your present state of health, do you have any difficulty performing the following activities?:   Driving? No  Managing money? No  Feeding yourself? No  Getting from bed to chair? No  Climbing a flight of stairs? No  Preparing food and eating?: No  Bathing or showering? No  Getting dressed: No  Getting to the toilet? No  Using the toilet:No  Moving around from place to place: No  In the past year have you fallen or had a near fall?:No  Are you sexually active?  Yes Do you have more than one partner? No   Hearing Difficulties: No  Do you often ask people to speak up or repeat themselves? No  Do you experience  ringing or noises in your ears? No  Do you have difficulty understanding soft or whispered voices?  Yes Do you feel that you have a problem with memory? No Do you often misplace items? No    Home Safety:  Do you have a smoke alarm at your residence? Yes Do you have grab bars in the bathroom?  No Do you have throw rugs in your house?  No  Cognitive Testing  Alert? Yes Normal Appearance?Yes  Oriented to person? Yes Place? Yes  Time? Yes  Recall of three objects? Yes  Can perform simple calculations? Yes  Displays appropriate judgment?Yes  Can read the correct time from a watch face?Yes   List the Names of Other Physician/Practitioners you currently use:  See referral list for the physicians patient is currently seeing.  Dr. Earlean Shawl   Review of Systems: See above   Objective:     General appearance: Appears younger than stated age Head: Normocephalic, without obvious abnormality, atraumatic  Eyes: conj clear, EOMi PEERLA  Ears: normal TM's and external ear canals both ears  Nose: Nares normal. Septum midline. Mucosa normal. No drainage or sinus tenderness.  Throat: lips, mucosa, and tongue normal; teeth and gums normal  Neck: no adenopathy, no carotid bruit, no JVD, supple, symmetrical, trachea midline and thyroid not enlarged, symmetric, no tenderness/mass/nodules  No CVA tenderness.  Lungs: clear to auscultation bilaterally  Breasts: normal appearance, no masses or tenderness Heart: regular rate and rhythm, S1, S2 normal, no murmur, click, rub or gallop  Abdomen: soft, non-tender; bowel sounds normal; no masses, no organomegaly  Musculoskeletal: ROM normal in all joints, no crepitus, no deformity, Normal muscle strengthen. Back  is symmetric, no curvature. Skin: Skin color, texture, turgor normal. No rashes or lesions  Lymph nodes: Cervical, supraclavicular, and axillary nodes normal.  Neurologic: CN 2 -12 Normal, Normal symmetric reflexes. Normal coordination and gait    Psych: Alert & Oriented x 3, Mood appear stable.    Assessment:    Annual wellness medicare exam   Plan:    During the course of the visit the patient was educated and counseled about appropriate screening and preventive services including:        Patient Instructions (the written plan) was given to the patient.  Medicare Attestation  I have personally reviewed:  The patient's medical and social history  Their use of alcohol, tobacco or illicit drugs  Their current medications and supplements  The patient's functional ability including ADLs,fall risks, home safety risks, cognitive, and hearing and visual impairment  Diet and physical activities  Evidence for depression or mood disorders  The patient's weight, height, BMI, and visual acuity have been recorded in the chart. I have made referrals, counseling, and provided education to the patient based on review of the above and I have provided the patient with a written personalized care plan for preventive services.

## 2018-09-24 NOTE — Patient Instructions (Addendum)
It was a pleasure to see you today.  Return in 1 year or as needed.  Continue same medications.

## 2018-12-10 IMAGING — CR DG CERVICAL SPINE COMPLETE 4+V
6 series · 6 of 6 positions shown · non-contrast
Comparison: None.

CLINICAL DATA: C/o neck pain and muscle tightness BILAT shoulders /
no trauma / jdh 315

EXAM:
CERVICAL SPINE - COMPLETE 4+ VIEW

[w cervical spine lat]
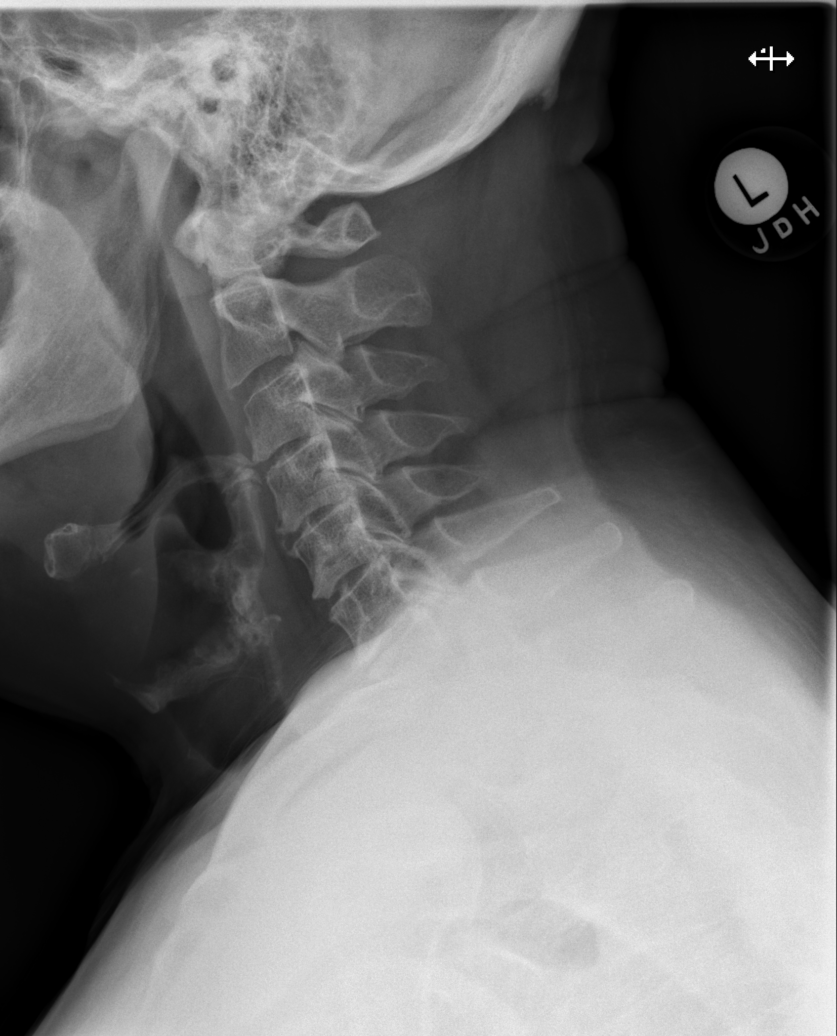

[w cervical spine ap_obl (1 of 2)]
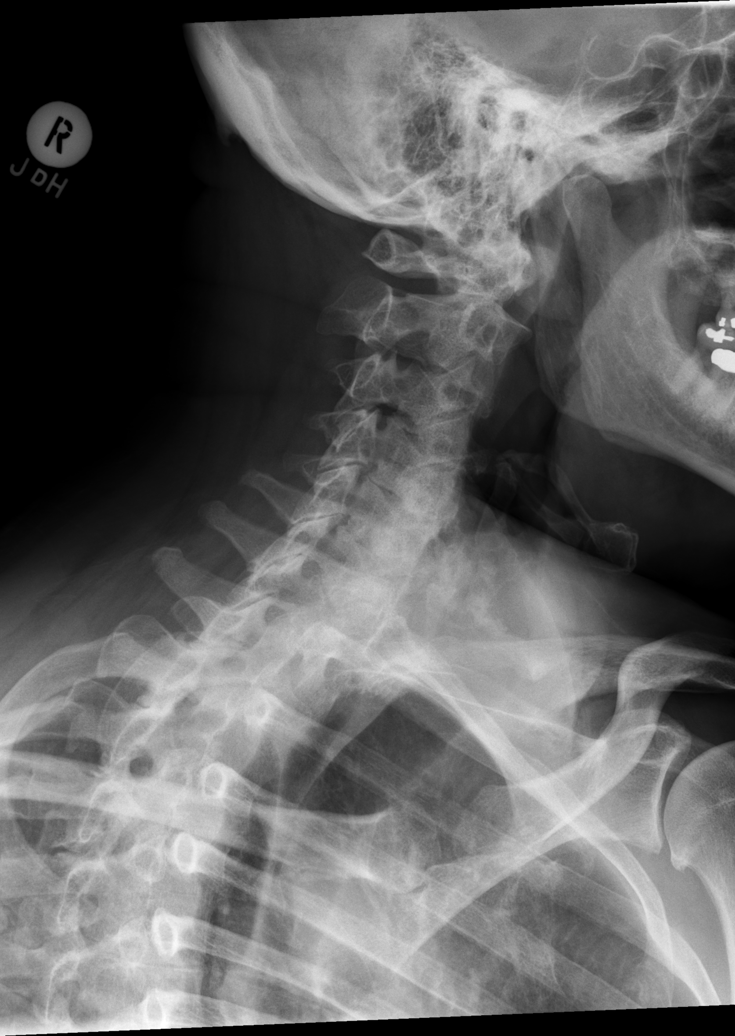

[w cervical spine ap_obl (2 of 2)]
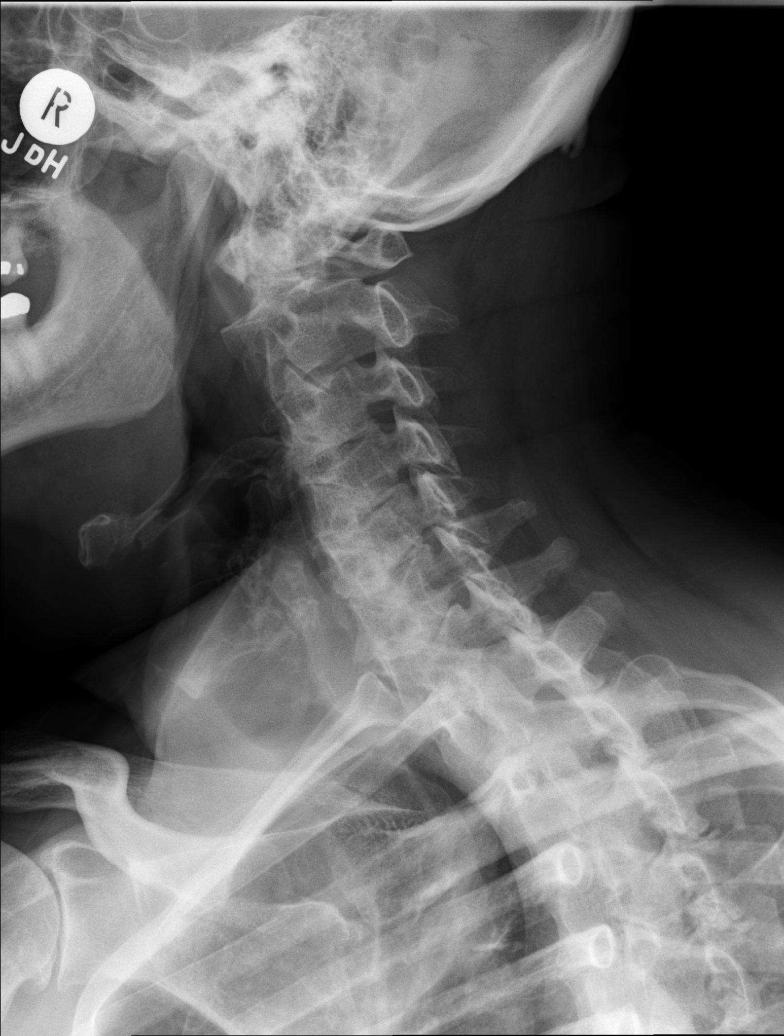

[w cervical spine ap]
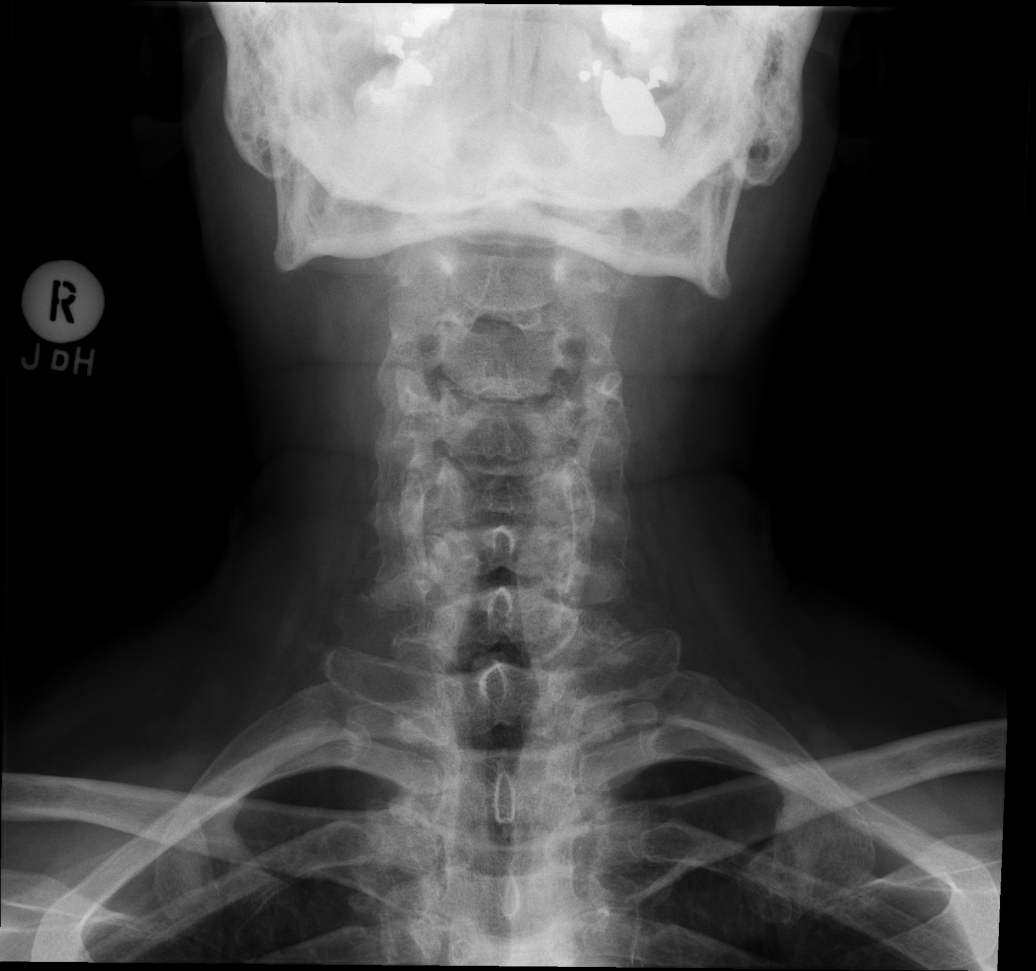

[w cervical swimmers]
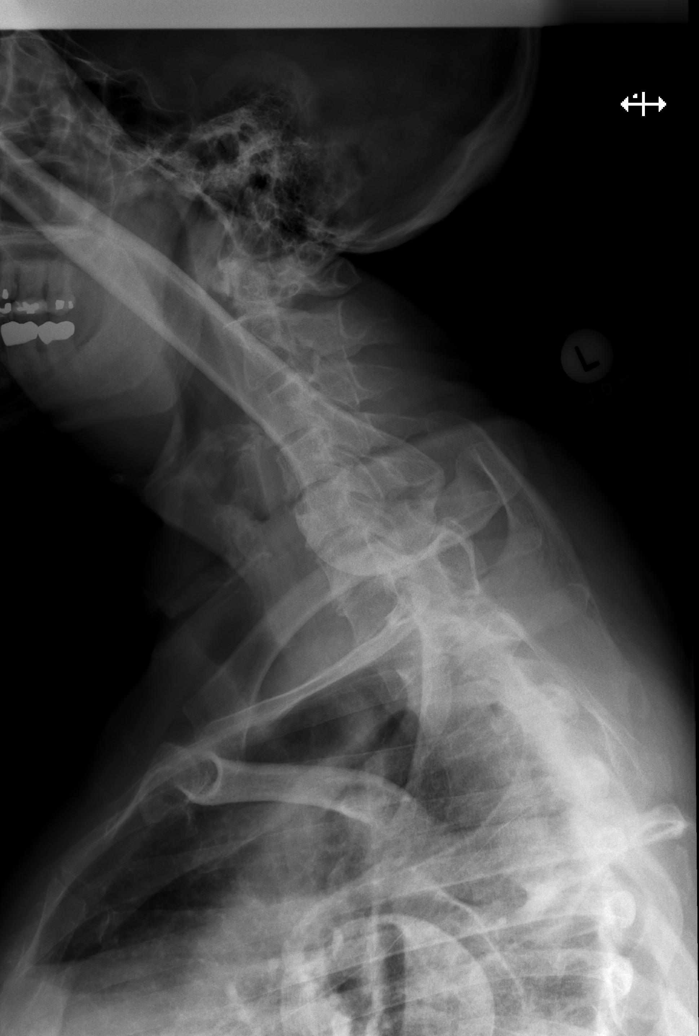

[t cervical spine odontoid]
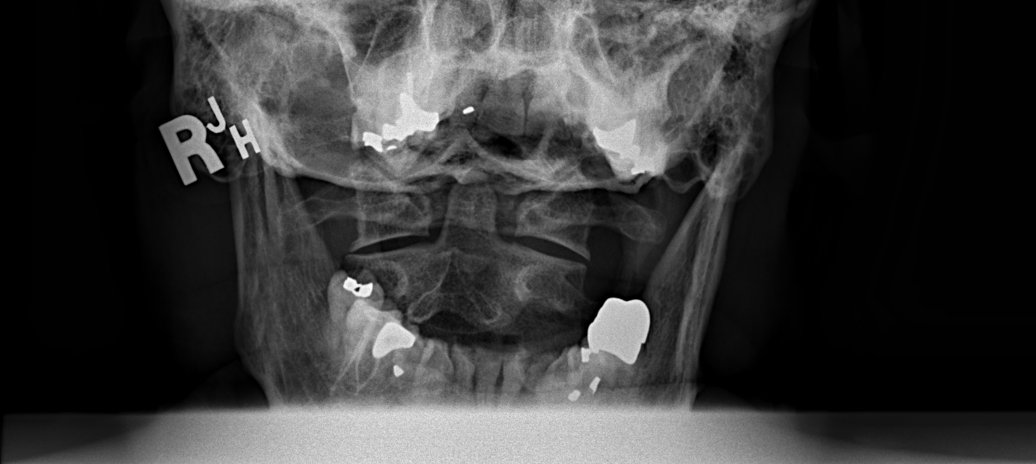

[6 of 6 positions shown; findings below may reference images not displayed]

FINDINGS: Alignment is normal. No fracture line or displaced fracture fragment
seen. No acute or suspicious osseous lesion.

Degenerative changes noted at multiple levels of the mid and lower
cervical spine, with associated disc space narrowings and osseous
spurring, mild to moderate in degree. At least moderate neural
foramen narrowings are seen at multiple levels of the mid and lower
cervical spine bilaterally.

Prevertebral soft tissues are normal in thickness. Paravertebral
soft tissues are unremarkable.
IMPRESSION: 1. No acute findings.
2. Degenerative changes of the cervical spine, as detailed above.
Associated osseous neural foramen narrowings at multiple levels,
bilateral, raising the possibility of associated nerve root
impingement. If symptoms could be radiculopathic in nature, would
consider nonemergent cervical spine MRI for further
characterization.

## 2018-12-24 ENCOUNTER — Other Ambulatory Visit: Payer: Self-pay

## 2018-12-24 MED ORDER — LOSARTAN POTASSIUM-HCTZ 100-25 MG PO TABS: 1.0000 | ORAL_TABLET | Freq: Every day | ORAL | 2 refills | Status: DC

## 2018-12-24 MED ORDER — ROSUVASTATIN CALCIUM 10 MG PO TABS: 10.0000 mg | ORAL_TABLET | Freq: Every day | ORAL | 0 refills | Status: DC

## 2018-12-28 ENCOUNTER — Other Ambulatory Visit: Payer: Self-pay

## 2018-12-28 MED ORDER — AMLODIPINE BESYLATE 5 MG PO TABS: 5.0000 mg | ORAL_TABLET | Freq: Every day | ORAL | 1 refills | Status: DC

## 2019-01-12 ENCOUNTER — Other Ambulatory Visit: Payer: Self-pay | Admitting: Internal Medicine

## 2019-01-26 ENCOUNTER — Other Ambulatory Visit: Payer: Self-pay | Admitting: Internal Medicine

## 2019-02-16 ENCOUNTER — Other Ambulatory Visit: Payer: Self-pay | Admitting: Internal Medicine

## 2019-03-14 ENCOUNTER — Other Ambulatory Visit: Payer: Self-pay | Admitting: Internal Medicine

## 2019-03-14 NOTE — Telephone Encounter (Signed)
Set up CPE for November before refilling

## 2019-07-02 ENCOUNTER — Other Ambulatory Visit: Payer: Self-pay | Admitting: Internal Medicine

## 2019-08-31 ENCOUNTER — Other Ambulatory Visit: Payer: Self-pay | Admitting: Internal Medicine

## 2019-09-13 ENCOUNTER — Other Ambulatory Visit: Payer: Self-pay

## 2019-09-13 ENCOUNTER — Other Ambulatory Visit: Payer: Medicare Other | Admitting: Internal Medicine

## 2019-09-13 DIAGNOSIS — F419 Anxiety disorder, unspecified: Secondary | ICD-10-CM

## 2019-09-13 DIAGNOSIS — M26609 Unspecified temporomandibular joint disorder, unspecified side: Secondary | ICD-10-CM

## 2019-09-13 DIAGNOSIS — Z125 Encounter for screening for malignant neoplasm of prostate: Secondary | ICD-10-CM

## 2019-09-13 DIAGNOSIS — I10 Essential (primary) hypertension: Secondary | ICD-10-CM

## 2019-09-13 DIAGNOSIS — Z86010 Personal history of colonic polyps: Secondary | ICD-10-CM

## 2019-09-13 DIAGNOSIS — E7800 Pure hypercholesterolemia, unspecified: Secondary | ICD-10-CM

## 2019-09-13 DIAGNOSIS — Z0000 Encounter for general adult medical examination without abnormal findings: Secondary | ICD-10-CM

## 2019-09-14 LAB — COMPLETE METABOLIC PANEL WITH GFR
AG Ratio: 2 (calc) (ref 1.0–2.5)
ALT: 27 U/L (ref 9–46)
AST: 23 U/L (ref 10–35)
Albumin: 4.3 g/dL (ref 3.6–5.1)
Alkaline phosphatase (APISO): 41 U/L (ref 35–144)
BUN: 17 mg/dL (ref 7–25)
CO2: 25 mmol/L (ref 20–32)
Calcium: 9.6 mg/dL (ref 8.6–10.3)
Chloride: 106 mmol/L (ref 98–110)
Creat: 0.9 mg/dL (ref 0.70–1.18)
GFR, Est African American: 99 mL/min/{1.73_m2} (ref >=60)
GFR, Est Non African American: 85 mL/min/{1.73_m2} (ref >=60)
Globulin: 2.2 g/dL (calc) (ref 1.9–3.7)
Glucose, Bld: 92 mg/dL (ref 65–99)
Potassium: 4.1 mmol/L (ref 3.5–5.3)
Sodium: 143 mmol/L (ref 135–146)
Total Bilirubin: 0.4 mg/dL (ref 0.2–1.2)
Total Protein: 6.5 g/dL (ref 6.1–8.1)

## 2019-09-14 LAB — CBC WITH DIFFERENTIAL/PLATELET
Absolute Monocytes: 857 cells/uL (ref 200–950)
Basophils Absolute: 50 cells/uL (ref 0–200)
Basophils Relative: 0.9 %
Eosinophils Absolute: 218 cells/uL (ref 15–500)
Eosinophils Relative: 3.9 %
HCT: 39.2 % (ref 38.5–50.0)
Hemoglobin: 13.7 g/dL (ref 13.2–17.1)
Lymphs Abs: 2206 cells/uL (ref 850–3900)
MCH: 34.3 pg — ABNORMAL HIGH (ref 27.0–33.0)
MCHC: 34.9 g/dL (ref 32.0–36.0)
MCV: 98 fL (ref 80.0–100.0)
MPV: 9.3 fL (ref 7.5–12.5)
Monocytes Relative: 15.3 %
Neutro Abs: 2268 cells/uL (ref 1500–7800)
Neutrophils Relative %: 40.5 %
Platelets: 216 10*3/uL (ref 140–400)
RBC: 4 10*6/uL — ABNORMAL LOW (ref 4.20–5.80)
RDW: 12.5 % (ref 11.0–15.0)
Total Lymphocyte: 39.4 %
WBC: 5.6 10*3/uL (ref 3.8–10.8)

## 2019-09-14 LAB — LIPID PANEL
Cholesterol: 179 mg/dL (ref <200)
HDL: 91 mg/dL (ref >=40)
LDL Cholesterol (Calc): 75 mg/dL (calc)
Non-HDL Cholesterol (Calc): 88 mg/dL (calc) (ref <130)
Total CHOL/HDL Ratio: 2 (calc) (ref <5.0)
Triglycerides: 51 mg/dL (ref <150)

## 2019-09-14 LAB — PSA: PSA: 1.7 ng/mL (ref <=4.0)

## 2019-09-16 ENCOUNTER — Ambulatory Visit (INDEPENDENT_AMBULATORY_CARE_PROVIDER_SITE_OTHER): Payer: Medicare Other | Admitting: Internal Medicine

## 2019-09-16 ENCOUNTER — Other Ambulatory Visit: Payer: Self-pay

## 2019-09-16 ENCOUNTER — Encounter: Payer: Self-pay | Admitting: Internal Medicine

## 2019-09-16 VITALS — BP 120/70 | HR 70 | Temp 98.0°F | Ht 68.0 in | Wt 184.0 lb

## 2019-09-16 DIAGNOSIS — I10 Essential (primary) hypertension: Secondary | ICD-10-CM

## 2019-09-16 DIAGNOSIS — J301 Allergic rhinitis due to pollen: Secondary | ICD-10-CM

## 2019-09-16 DIAGNOSIS — M65342 Trigger finger, left ring finger: Secondary | ICD-10-CM

## 2019-09-16 DIAGNOSIS — Z0000 Encounter for general adult medical examination without abnormal findings: Secondary | ICD-10-CM | POA: Diagnosis not present

## 2019-09-16 DIAGNOSIS — R809 Proteinuria, unspecified: Secondary | ICD-10-CM

## 2019-09-16 DIAGNOSIS — R7989 Other specified abnormal findings of blood chemistry: Secondary | ICD-10-CM

## 2019-09-16 DIAGNOSIS — E7800 Pure hypercholesterolemia, unspecified: Secondary | ICD-10-CM

## 2019-09-16 DIAGNOSIS — Z8659 Personal history of other mental and behavioral disorders: Secondary | ICD-10-CM

## 2019-09-16 DIAGNOSIS — Z860101 Personal history of adenomatous and serrated colon polyps: Secondary | ICD-10-CM

## 2019-09-16 DIAGNOSIS — L720 Epidermal cyst: Secondary | ICD-10-CM

## 2019-09-16 DIAGNOSIS — Z86010 Personal history of colonic polyps: Secondary | ICD-10-CM

## 2019-09-16 LAB — POCT URINALYSIS DIPSTICK
Appearance: NEGATIVE
Bilirubin, UA: NEGATIVE
Blood, UA: NEGATIVE
Glucose, UA: NEGATIVE
Ketones, UA: NEGATIVE
Leukocytes, UA: NEGATIVE
Nitrite, UA: NEGATIVE
Odor: NEGATIVE
Protein, UA: POSITIVE — AB
Spec Grav, UA: 1.015 (ref 1.010–1.025)
Urobilinogen, UA: 0.2 E.U./dL
pH, UA: 6.5 (ref 5.0–8.0)

## 2019-09-16 MED ORDER — ALPRAZOLAM 0.5 MG PO TABS: 0.5000 mg | ORAL_TABLET | Freq: Two times a day (BID) | ORAL | 1 refills | Status: DC | PRN

## 2019-09-16 NOTE — Patient Instructions (Addendum)
It was a pleasure to see you today.  Continue current medications and follow-up in 1 year or as needed.  Xanax refilled.  Please have dermatologist check lesion right neck above clavicle and address epidermoid cyst.  May have  trigger finger and Morton's neuroma of foot injected by sports medicine physician.

## 2019-09-16 NOTE — Progress Notes (Signed)
Subjective:    Patient ID: Andrew Cole, male    DOB: 1946-03-14, 73 y.o.   MRN: [id number]  HPI 73 year old Male in today for health maintenance exam and evaluation of medical issues.  He has a history of hypertension hyperlipidemia allergic rhinitis and anxiety.    History of adenomatous colon polyps.  Had colonoscopy by Dr. Earlean Shawl in 2016 with single polyp noted.  He had 16 polyps removed by Dr. Earlean Shawl in 2019 and recall due 2022.  All were tubular adenomas.  Zocor causes myalgias but Crestor is tolerated.  Longstanding history of hypertension well-controlled on current regimen.  History of anxiety treated with benzodiazepine medication  History of allergic rhinitis  Social history: He is married.  2 adult sons.  Does not smoke.  Smoked in the remote past.  Social alcohol consumption.  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  Family history: Mother died with metastatic ovarian cancer.  Father with history of colon polyps and hyperlipidemia.  1 brother and 2 sisters.  Review of Systems He has an epidermoid cyst on his back and a dark lesion above the right clavicle.  Recommend he see dermatologist.  Since he has Morton's neuroma of the foot.  This can be injected by sports medicine physician.     Objective:   Physical Exam Blood pressure 120/70, BMI 27.98 pulse 70 temperature 98 degrees orally pulse oximetry 97%.  Skin warm and dry.  Nodes none.  Neck is supple without JVD thyromegaly or carotid bruits.  Chest clear to auscultation.  Cardiac exam regular rate and rhythm.  Abdomen benign no hepatosplenomegaly masses or tenderness.  Prostate is normal without nodules.  No lower extremity edema.  Affect is normal.  Neuro no focal deficits on brief neurological exam.  He has a dark lesion above his right clavicle that needs dermatology attention.  Also has epidermoid cyst on his back that could be related to the size desires.  He has left fourth trigger finger  which can be injected by hand surgeon     Assessment & Plan:  Essential hypertension-stable on current regimen of losartan HCTZ and amlodipine  Allergic rhinitis-stable  History of anxiety treated with benzodiazepine medication  Hyperlipidemia treated with Crestor with labile lipid panel  History of TMJ syndrome  Adenomatous colon polyps followed by Dr. Earlean Shawl with repeat study due 2022  Plan: Return in 1 year or as needed.  He monitors his blood pressure at home.    Subjective:   Patient presents for Medicare Annual/Subsequent preventive examination.  Review Past Medical/Family/Social: See above  Risk Factors  Current exercise habits: Physically active with shoulders Dietary issues discussed: Low-fat low carbohydrate  Cardiac risk factors: Hyperlipidemia  Depression Screen  (Note: if answer to either of the following is "Yes", a more complete depression screening is indicated)   Over the past two weeks, have you felt down, depressed or hopeless? No  Over the past two weeks, have you felt little interest or pleasure in doing things? No Have you lost interest or pleasure in daily life? No Do you often feel hopeless? No Do you cry easily over simple problems? No   Activities of Daily Living  In your present state of health, do you have any difficulty performing the following activities?:   Driving? No  Managing money? No  Feeding yourself? No  Getting from bed to chair? No  Climbing a flight of stairs? No  Preparing food and eating?: No  Bathing or showering? No  Getting dressed: No  Getting to the toilet? No  Using the toilet:No  Moving around from place to place: No  In the past year have you fallen or had a near fall?:No  Are you sexually active?  Yes do you have more than one partner? No   Hearing Difficulties: No  Do you often ask people to speak up or repeat themselves? No  Do you experience ringing or noises in your ears? No  Do you have difficulty  understanding soft or whispered voices?  Yes Do you feel that you have a problem with memory? No Do you often misplace items? No    Home Safety:  Do you have a smoke alarm at your residence? Yes Do you have grab bars in the bathroom?  None Do you have throw rugs in your house?  None   Cognitive Testing  Alert? Yes Normal Appearance?Yes  Oriented to person? Yes Place? Yes  Time? Yes  Recall of three objects? Yes  Can perform simple calculations? Yes  Displays appropriate judgment?Yes  Can read the correct time from a watch face?Yes   List the Names of Other Physician/Practitioners you currently use:  See referral list for the physicians patient is currently seeing.  Dr. Earlean Shawl  Review of Systems: See above   Objective:     General appearance: Appears younger than stated age Head: Normocephalic, without obvious abnormality, atraumatic  Eyes: conj clear, EOMi PEERLA  Ears: normal TM's and external ear canals both ears  Nose: Nares normal. Septum midline. Mucosa normal. No drainage or sinus tenderness.  Throat: lips, mucosa, and tongue normal; teeth and gums normal  Neck: no adenopathy, no carotid bruit, no JVD, supple, symmetrical, trachea midline and thyroid not enlarged, symmetric, no tenderness/mass/nodules  No CVA tenderness.  Lungs: clear to auscultation bilaterally  Breasts: normal appearance Heart: regular rate and rhythm, S1, S2 normal, no murmur, click, rub or gallop  Abdomen: soft, non-tender; bowel sounds normal; no masses, no organomegaly  Musculoskeletal: ROM normal in all joints, no crepitus, no deformity, Normal muscle strengthen. Back  is symmetric, no curvature. Skin: Skin color, texture, turgor normal. No rashes or lesions  Lymph nodes: Cervical, supraclavicular, and axillary nodes normal.  Neurologic: CN 2 -12 Normal, Normal symmetric reflexes. Normal coordination and gait  Psych: Alert & Oriented x 3, Mood appear stable.    Assessment:    Annual  wellness medicare exam   Plan:    During the course of the visit the patient was educated and counseled about appropriate screening and preventive services including:  Annual flu vaccine      Patient Instructions (the written plan) was given to the patient.  Medicare Attestation  I have personally reviewed:  The patient's medical and social history  Their use of alcohol, tobacco or illicit drugs  Their current medications and supplements  The patient's functional ability including ADLs,fall risks, home safety risks, cognitive, and hearing and visual impairment  Diet and physical activities  Evidence for depression or mood disorders  The patient's weight, height, BMI, and visual acuity have been recorded in the chart. I have made referrals, counseling, and provided education to the patient based on review of the above and I have provided the patient with a written personalized care plan for preventive services.

## 2019-09-17 LAB — MICROALBUMIN / CREATININE URINE RATIO
Creatinine, Urine: 81 mg/dL (ref 20–320)
Microalb Creat Ratio: 343 mcg/mg creat — ABNORMAL HIGH (ref <30)
Microalb, Ur: 27.8 mg/dL

## 2019-10-04 ENCOUNTER — Telehealth: Payer: Self-pay | Admitting: Internal Medicine

## 2019-10-04 ENCOUNTER — Encounter: Payer: Self-pay | Admitting: Internal Medicine

## 2019-10-04 ENCOUNTER — Ambulatory Visit (INDEPENDENT_AMBULATORY_CARE_PROVIDER_SITE_OTHER): Payer: Medicare Other | Admitting: Internal Medicine

## 2019-10-04 VITALS — Ht 68.0 in | Wt 184.0 lb

## 2019-10-04 DIAGNOSIS — J301 Allergic rhinitis due to pollen: Secondary | ICD-10-CM

## 2019-10-04 DIAGNOSIS — L290 Pruritus ani: Secondary | ICD-10-CM

## 2019-10-04 MED ORDER — ALBUTEROL SULFATE HFA 108 (90 BASE) MCG/ACT IN AERS: INHALATION_SPRAY | RESPIRATORY_TRACT | 99 refills | Status: DC

## 2019-10-04 MED ORDER — HYDROCORT-PRAMOXINE (PERIANAL) 2.5-1 % EX CREA: 1.0000 "application " | TOPICAL_CREAM | Freq: Three times a day (TID) | CUTANEOUS | 99 refills | Status: DC

## 2019-10-04 NOTE — Progress Notes (Signed)
   Subjective:    Patient ID: Andrew Cole, male    DOB: 23-Dec-1945, 73 y.o.   MRN: [id number]  HPI Patient called earlier today and was having some anal irritation.  He has not had this recently.  A phone call visit was set up with me to discuss.  He is not having rectal bleeding just some perianal irritation and itching.  Also has history of occasional bronchospasm and would like refill on ProAir inhaler which was provided today.  We connected by audio only.  He is identified using 2 identifiers as Andrew Cole a longstanding patient in this practice and is agreeable to visit in this format today.  He previously has used a nail pram HC up to 3 times daily as needed for anal irritation and this will be refilled.  He has history of hypertension treated with losartan HCTZ and amlodipine.  History of hyperlipidemia treated with Crestor.  History of allergic rhinitis treated with Flonase.     Review of Systems see above     Objective:   Physical Exam  Not examined but spoke with him by phone for 10 minutes      Assessment & Plan:  Anal irritation-refill of Analpram HC up to 3 times daily as needed for anal irritation.  Does not have rectal bleeding or hemorrhoids.  Hypertension stable on 3 drug regimen  Hyperlipidemia-treated with Crestor and stable  Anxiety treated with Xanax up to twice daily  Occasional bronchospasm treated with albuterol inhaler  Allergic rhinitis treated with Flonase  Plan: His physical exam was done here in November.  He is due to return in November 2021.

## 2019-10-04 NOTE — Telephone Encounter (Signed)
Set up phone call with me

## 2019-10-04 NOTE — Telephone Encounter (Signed)
Thaison Kaluza  [phone]  Analpram HC 2.5%  Mehdi called to say he is having a little flare up of anal irritation, he said he had this in the past but it had been awhile. I did let him know we would need phone call or virtual visit to fill medication.   He would also like to get a refill on PROAIR HFA 108 (90 Base) MCG/ACT inhaler   [facility] [address]

## 2019-10-04 NOTE — Telephone Encounter (Signed)
Phone call set for this afternoon at 4:30

## 2019-10-26 NOTE — Patient Instructions (Signed)
Analpram HC refilled as well as albuterol inhaler.

## 2019-11-16 ENCOUNTER — Telehealth: Payer: Self-pay | Admitting: Internal Medicine

## 2019-11-16 NOTE — Telephone Encounter (Signed)
Andrew Cole [phone]   Andrew Cole called to say he is completely out of his below medication, He has changed pharmacies. He is now using [facility], they told him they had faxed a request on the 13 th and today for refills. We have not received anything from them. I also talked with a pharmacist from [facility] earlier and told him we had not received a fax from them and he told me he was sending right then.    he asked if we could [facility] [address] and then send  Prescriptions to [facility] mail in.   rosuvastatin (CRESTOR) 10 MG tablet   He also would like a new prescription sent to [facility] for his   losartan-hydrochlorothiazide (HYZAAR) 100-25 MG tablet  he has about a 10 day supply of this.

## 2019-11-16 NOTE — Telephone Encounter (Signed)
Please handle these refills  and refill each for 6 months

## 2019-11-17 MED ORDER — LOSARTAN POTASSIUM-HCTZ 100-25 MG PO TABS: 1.0000 | ORAL_TABLET | Freq: Every day | ORAL | 1 refills | Status: DC

## 2019-11-17 MED ORDER — ROSUVASTATIN CALCIUM 10 MG PO TABS: 10.0000 mg | ORAL_TABLET | Freq: Every day | ORAL | 1 refills | Status: DC

## 2019-11-17 MED ORDER — LOSARTAN POTASSIUM-HCTZ 100-25 MG PO TABS: 1.0000 | ORAL_TABLET | Freq: Every day | ORAL | 0 refills | Status: DC

## 2019-11-17 MED ORDER — ROSUVASTATIN CALCIUM 10 MG PO TABS: 10.0000 mg | ORAL_TABLET | Freq: Every day | ORAL | 0 refills | Status: DC

## 2019-11-21 ENCOUNTER — Other Ambulatory Visit: Payer: Self-pay

## 2019-11-21 MED ORDER — LOSARTAN POTASSIUM-HCTZ 100-25 MG PO TABS: 1.0000 | ORAL_TABLET | Freq: Every day | ORAL | 3 refills | Status: DC

## 2019-11-21 MED ORDER — ROSUVASTATIN CALCIUM 10 MG PO TABS: 10.0000 mg | ORAL_TABLET | Freq: Every day | ORAL | 3 refills | Status: DC

## 2019-11-22 ENCOUNTER — Ambulatory Visit: Payer: Medicare Other

## 2019-12-01 ENCOUNTER — Ambulatory Visit: Payer: Medicare Other

## 2019-12-09 ENCOUNTER — Ambulatory Visit: Payer: Medicare Other

## 2020-01-17 DIAGNOSIS — M79642 Pain in left hand: Secondary | ICD-10-CM | POA: Diagnosis not present

## 2020-01-17 DIAGNOSIS — M1810 Unilateral primary osteoarthritis of first carpometacarpal joint, unspecified hand: Secondary | ICD-10-CM | POA: Diagnosis not present

## 2020-01-17 DIAGNOSIS — M79641 Pain in right hand: Secondary | ICD-10-CM | POA: Diagnosis not present

## 2020-01-17 DIAGNOSIS — M19049 Primary osteoarthritis, unspecified hand: Secondary | ICD-10-CM | POA: Diagnosis not present

## 2020-01-17 DIAGNOSIS — M65331 Trigger finger, right middle finger: Secondary | ICD-10-CM | POA: Diagnosis not present

## 2020-02-29 ENCOUNTER — Telehealth: Payer: Self-pay

## 2020-02-29 NOTE — Telephone Encounter (Signed)
Patient called his new insurance dos not cover hydrocortisone 2.5% rectal cream he said he was told they cover hydrocortisone 2.5% topical cream and he wants that sent to his [place].

## 2020-03-01 NOTE — Telephone Encounter (Signed)
Pend and I will sign

## 2020-03-03 ENCOUNTER — Other Ambulatory Visit: Payer: Self-pay | Admitting: Internal Medicine

## 2020-03-06 MED ORDER — HYDROCORTISONE (PERIANAL) 2.5 % EX CREA: 1.0000 "application " | TOPICAL_CREAM | Freq: Two times a day (BID) | CUTANEOUS | 5 refills | Status: DC

## 2020-05-15 DIAGNOSIS — H35033 Hypertensive retinopathy, bilateral: Secondary | ICD-10-CM | POA: Diagnosis not present

## 2020-06-25 DIAGNOSIS — Z20822 Contact with and (suspected) exposure to covid-19: Secondary | ICD-10-CM | POA: Diagnosis not present

## 2020-08-12 ENCOUNTER — Other Ambulatory Visit: Payer: Self-pay | Admitting: Internal Medicine

## 2020-08-30 DIAGNOSIS — M1711 Unilateral primary osteoarthritis, right knee: Secondary | ICD-10-CM | POA: Diagnosis not present

## 2020-08-30 DIAGNOSIS — M25561 Pain in right knee: Secondary | ICD-10-CM | POA: Diagnosis not present

## 2020-09-24 ENCOUNTER — Other Ambulatory Visit: Payer: Self-pay

## 2020-09-24 ENCOUNTER — Other Ambulatory Visit: Payer: Medicare PPO | Admitting: Internal Medicine

## 2020-09-24 DIAGNOSIS — Z0000 Encounter for general adult medical examination without abnormal findings: Secondary | ICD-10-CM | POA: Diagnosis not present

## 2020-09-24 DIAGNOSIS — M65342 Trigger finger, left ring finger: Secondary | ICD-10-CM

## 2020-09-24 DIAGNOSIS — L720 Epidermal cyst: Secondary | ICD-10-CM | POA: Diagnosis not present

## 2020-09-24 DIAGNOSIS — I10 Essential (primary) hypertension: Secondary | ICD-10-CM | POA: Diagnosis not present

## 2020-09-24 DIAGNOSIS — E7800 Pure hypercholesterolemia, unspecified: Secondary | ICD-10-CM | POA: Diagnosis not present

## 2020-09-24 DIAGNOSIS — R809 Proteinuria, unspecified: Secondary | ICD-10-CM | POA: Diagnosis not present

## 2020-09-25 LAB — CBC WITH DIFFERENTIAL/PLATELET
Absolute Monocytes: 769 cells/uL (ref 200–950)
Basophils Absolute: 53 cells/uL (ref 0–200)
Basophils Relative: 1 %
Eosinophils Absolute: 223 cells/uL (ref 15–500)
Eosinophils Relative: 4.2 %
HCT: 42.1 % (ref 38.5–50.0)
Hemoglobin: 14.3 g/dL (ref 13.2–17.1)
Lymphs Abs: 2374 cells/uL (ref 850–3900)
MCH: 32.5 pg (ref 27.0–33.0)
MCHC: 34 g/dL (ref 32.0–36.0)
MCV: 95.7 fL (ref 80.0–100.0)
MPV: 9.2 fL (ref 7.5–12.5)
Monocytes Relative: 14.5 %
Neutro Abs: 1882 cells/uL (ref 1500–7800)
Neutrophils Relative %: 35.5 %
Platelets: 283 10*3/uL (ref 140–400)
RBC: 4.4 10*6/uL (ref 4.20–5.80)
RDW: 12.4 % (ref 11.0–15.0)
Total Lymphocyte: 44.8 %
WBC: 5.3 10*3/uL (ref 3.8–10.8)

## 2020-09-25 LAB — LIPID PANEL
Cholesterol: 230 mg/dL — ABNORMAL HIGH (ref <200)
HDL: 101 mg/dL (ref >=40)
LDL Cholesterol (Calc): 113 mg/dL (calc) — ABNORMAL HIGH
Non-HDL Cholesterol (Calc): 129 mg/dL (calc) (ref <130)
Total CHOL/HDL Ratio: 2.3 (calc) (ref <5.0)
Triglycerides: 69 mg/dL (ref <150)

## 2020-09-25 LAB — COMPLETE METABOLIC PANEL WITH GFR
AG Ratio: 2 (calc) (ref 1.0–2.5)
ALT: 26 U/L (ref 9–46)
AST: 17 U/L (ref 10–35)
Albumin: 4.5 g/dL (ref 3.6–5.1)
Alkaline phosphatase (APISO): 49 U/L (ref 35–144)
BUN: 18 mg/dL (ref 7–25)
CO2: 31 mmol/L (ref 20–32)
Calcium: 10 mg/dL (ref 8.6–10.3)
Chloride: 101 mmol/L (ref 98–110)
Creat: 0.94 mg/dL (ref 0.70–1.18)
GFR, Est African American: 93 mL/min/{1.73_m2} (ref >=60)
GFR, Est Non African American: 80 mL/min/{1.73_m2} (ref >=60)
Globulin: 2.2 g/dL (calc) (ref 1.9–3.7)
Glucose, Bld: 90 mg/dL (ref 65–99)
Potassium: 4.4 mmol/L (ref 3.5–5.3)
Sodium: 140 mmol/L (ref 135–146)
Total Bilirubin: 0.5 mg/dL (ref 0.2–1.2)
Total Protein: 6.7 g/dL (ref 6.1–8.1)

## 2020-09-25 LAB — PSA: PSA: 2.43 ng/mL (ref <=4.0)

## 2020-09-28 ENCOUNTER — Other Ambulatory Visit: Payer: Self-pay

## 2020-09-28 ENCOUNTER — Encounter: Payer: Self-pay | Admitting: Internal Medicine

## 2020-09-28 ENCOUNTER — Ambulatory Visit (INDEPENDENT_AMBULATORY_CARE_PROVIDER_SITE_OTHER): Payer: Medicare PPO | Admitting: Internal Medicine

## 2020-09-28 VITALS — BP 110/60 | HR 83 | Ht 68.0 in | Wt 182.0 lb

## 2020-09-28 DIAGNOSIS — I10 Essential (primary) hypertension: Secondary | ICD-10-CM

## 2020-09-28 DIAGNOSIS — J301 Allergic rhinitis due to pollen: Secondary | ICD-10-CM | POA: Diagnosis not present

## 2020-09-28 DIAGNOSIS — R809 Proteinuria, unspecified: Secondary | ICD-10-CM | POA: Diagnosis not present

## 2020-09-28 DIAGNOSIS — Z8659 Personal history of other mental and behavioral disorders: Secondary | ICD-10-CM

## 2020-09-28 DIAGNOSIS — Z86010 Personal history of colonic polyps: Secondary | ICD-10-CM

## 2020-09-28 DIAGNOSIS — Z0000 Encounter for general adult medical examination without abnormal findings: Secondary | ICD-10-CM | POA: Diagnosis not present

## 2020-09-28 DIAGNOSIS — E7800 Pure hypercholesterolemia, unspecified: Secondary | ICD-10-CM

## 2020-09-28 LAB — POCT URINALYSIS DIPSTICK
Appearance: NEGATIVE
Bilirubin, UA: NEGATIVE
Blood, UA: NEGATIVE
Glucose, UA: NEGATIVE
Ketones, UA: NEGATIVE
Leukocytes, UA: NEGATIVE
Nitrite, UA: NEGATIVE
Odor: NEGATIVE
Protein, UA: POSITIVE — AB
Spec Grav, UA: 1.01 (ref 1.010–1.025)
Urobilinogen, UA: 0.2 E.U./dL
pH, UA: 6.5 (ref 5.0–8.0)

## 2020-09-28 NOTE — Progress Notes (Signed)
Subjective:    Patient ID: [name], male    DOB: 02/06/1946, 74 y.o.   MRN: [id number]  HPI  74 year old Male for Medicare wellness, health maintenance exam and evaluation of medical issues.  He has a history of hypertension, hyperlipidemia, allergic rhinitis and anxiety.  History of adenomatous colon polyps.  Had colonoscopy by Dr. Earlean Cole in 2016 with single polyp noted.  In 2019, he had 16 polyps removed by Dr. Earlean Cole and due for recall in 2022.  All of these were tubular adenomas.  Zocor causes myalgias but Crestor is tolerated.  Longstanding history of hypertension well-controlled on current regimen.  History of anxiety treated with as needed Xanax.  History of allergic rhinitis.  Social history: He is married.  2 adult sons.  Does not smoke.  Smoked in the remote past.  Social alcohol consumption.  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  Family history: Mother died with metastatic ovarian cancer.  Father with history of colon polyps and hyperlipidemia.  1 brother and 2 sisters.    Review of Systems  Constitutional: Negative.   Respiratory: Negative.   Cardiovascular: Negative.   Gastrointestinal: Negative.   Genitourinary: Negative.   Neurological: Negative.   Psychiatric/Behavioral: Negative.        Objective:   Physical Exam Vitals reviewed.  Constitutional:      Appearance: Normal appearance.  HENT:     Head: Normocephalic.     Right Ear: Tympanic membrane normal.     Left Ear: Tympanic membrane normal.     Nose: Nose normal.  Eyes:     General: No scleral icterus.       Right eye: No discharge.        Left eye: No discharge.     Extraocular Movements: Extraocular movements intact.     Pupils: Pupils are equal, round, and reactive to light.  Cardiovascular:     Rate and Rhythm: Normal rate and regular rhythm.     Heart sounds: Normal heart sounds. No murmur heard.   Pulmonary:     Effort: Pulmonary effort is normal.      Breath sounds: Normal breath sounds. No wheezing.  Abdominal:     General: Bowel sounds are normal.     Palpations: Abdomen is soft. There is no mass.     Tenderness: There is no abdominal tenderness. There is no rebound.  Genitourinary:    Prostate: Normal.  Musculoskeletal:     Cervical back: Neck supple. No rigidity.     Right lower leg: No edema.     Left lower leg: No edema.  Skin:    General: Skin is warm and dry.     Findings: No rash.  Neurological:     General: No focal deficit present.     Mental Status: He is alert and oriented to person, place, and time.     Cranial Nerves: No cranial nerve deficit.     Sensory: No sensory deficit.     Coordination: Coordination normal.  Psychiatric:        Mood and Affect: Mood normal.        Behavior: Behavior normal.        Thought Content: Thought content normal.        Judgment: Judgment normal.           Assessment & Plan:  Pure hypercholesterolemia-total cholesterol is 230 and LDL cholesterol 113.  HDL is excellent at 101 and  triglycerides normal at 69.  Currently on generic Crestor 10 mg daily.  Watch diet.  Health maintenance-remainder of lab work is within normal limits  He is due for repeat colonoscopy 2022 with history of adenomatous colon polyps at last colonoscopy-16 total  History of anxiety treated with anxiety medication (Xanax)  Allergic rhinitis-stable  Essential hypertension stable on losartan HCTZ and amlodipine  Plan: Repeat lipid panel in February 2022.  He will watch diet and try to get some exercise in the meantime.  Also had micralbuminuria last year and also this year. May need further evaluation   Subjective:   Patient presents for Medicare Annual/Subsequent preventive examination.  Review Past Medical/Family/Social: See above   Risk Factors  Current exercise habits: not as much recently Dietary issues discussed: Discussed low-fat low carbohydrate diet  Cardiac risk factors:  Hyperlipidemia  Depression Screen  (Note: if answer to either of the following is "Yes", a more complete depression screening is indicated)   Over the past two weeks, have Andrew Cole felt down, depressed or hopeless? No  Over the past two weeks, have Andrew Cole felt little interest or pleasure in doing things? No Have Andrew Cole lost interest or pleasure in daily life? No Do Andrew Cole often feel hopeless? No Do Andrew Cole cry easily over simple problems? No   Activities of Daily Living  In your present state of health, do Andrew Cole have any difficulty performing the following activities?:   Driving? No  Managing money? No  Feeding yourself? No  Getting from bed to chair? No  Climbing a flight of stairs? No  Preparing food and eating?: No  Bathing or showering? No  Getting dressed: No  Getting to the toilet? No  Using the toilet:No  Moving around from place to place: No  In the past year have Andrew Cole fallen or had a near fall?:No  Are Andrew Cole sexually active? yes Do Andrew Cole have more than one partner? No   Hearing Difficulties: No  Do Andrew Cole often ask people to speak up or repeat themselves? No  Do Andrew Cole experience ringing or noises in your ears? No  Do Andrew Cole have difficulty understanding soft or whispered voices? yes Do Andrew Cole feel that Andrew Cole have a problem with memory? No Do Andrew Cole often misplace items? No    Home Safety:  Do Andrew Cole have a smoke alarm at your residence? Yes Do Andrew Cole have grab bars in the bathroom?no Do Andrew Cole have throw rugs in your house? no   Cognitive Testing  Alert? Yes Normal Appearance?Yes  Oriented to person? Yes Place? Yes  Time? Yes  Recall of three objects? Yes  Can perform simple calculations? Yes  Displays appropriate judgment?Yes  Can read the correct time from a watch face?Yes   List the Names of Other Physician/Practitioners Andrew Cole currently use:  See referral list for the physicians patient is currently seeing.     Review of Systems: see above   Objective:     General appearance: pleasant Male no  acute distress Head: Normocephalic, without obvious abnormality, atraumatic  Eyes: conj clear, EOMi PEERLA  Ears: normal TM's and external ear canals both ears  Nose: Nares normal. Septum midline. Mucosa normal. No drainage or sinus tenderness.  Throat: lips, mucosa, and tongue normal; teeth and gums normal  Neck: no adenopathy, no carotid bruit, no JVD, supple, symmetrical, trachea midline and thyroid not enlarged, symmetric, no tenderness/mass/nodules  No CVA tenderness.  Lungs: clear to auscultation bilaterally  Breasts: normal appearance, no masses or tenderness Heart: regular rate and rhythm, S1,  S2 normal, no murmur, click, rub or gallop  Abdomen: soft, non-tender; bowel sounds normal; no masses, no organomegaly  Musculoskeletal: ROM normal in all joints, no crepitus, no deformity, Normal muscle strengthen. Back  is symmetric, no curvature. Skin: Skin color, texture, turgor normal. No rashes or lesions  Lymph nodes: Cervical, supraclavicular, and axillary nodes normal.  Neurologic: CN 2 -12 Normal, Normal symmetric reflexes. Normal coordination and gait  Psych: Alert & Oriented x 3, Mood appear stable.    Assessment:    Annual wellness medicare exam   Plan:    During the course of the visit the patient was educated and counseled about appropriate screening and preventive services including:    due for colonoscopy 2022   Has had 3 Covid vaccines   Has had high dose flu vaccine     Patient Instructions (the written plan) was given to the patient.  Medicare Attestation  I have personally reviewed:  The patient's medical and social history  Their use of alcohol, tobacco or illicit drugs  Their current medications and supplements  The patient's functional ability including ADLs,fall risks, home safety risks, cognitive, and hearing and visual impairment  Diet and physical activities  Evidence for depression or mood disorders  The patient's weight, height, BMI, and visual  acuity have been recorded in the chart. I have made referrals, counseling, and provided education to the patient based on review of the above and I have provided the patient with a written personalized care plan for preventive services.

## 2020-09-28 NOTE — Patient Instructions (Addendum)
Continue Crestor 10 mg daily. RTC late January for lipid panel. Protein in urine may need further evaluation.

## 2020-09-29 LAB — MICROALBUMIN / CREATININE URINE RATIO
Creatinine, Urine: 83 mg/dL (ref 20–320)
Microalb Creat Ratio: 264 mcg/mg creat — ABNORMAL HIGH (ref <30)
Microalb, Ur: 21.9 mg/dL

## 2020-10-01 ENCOUNTER — Other Ambulatory Visit: Payer: Self-pay

## 2020-10-01 MED ORDER — ALPRAZOLAM 0.5 MG PO TABS
0.5000 mg | ORAL_TABLET | Freq: Two times a day (BID) | ORAL | 1 refills | Status: DC | PRN
Start: 2020-10-01 — End: 2021-02-18

## 2020-10-17 ENCOUNTER — Other Ambulatory Visit: Payer: Self-pay | Admitting: Internal Medicine

## 2020-11-18 DIAGNOSIS — Z20822 Contact with and (suspected) exposure to covid-19: Secondary | ICD-10-CM | POA: Diagnosis not present

## 2020-11-21 ENCOUNTER — Telehealth: Payer: Self-pay | Admitting: Internal Medicine

## 2020-11-21 NOTE — Telephone Encounter (Signed)
When I called to schedule patient for an appointment, he let me know that he had tested positive COVID-19 on 10/29/2020 with a home test, He has been vaccinated and only had mild symptoms of headache, runny nose and a dry cough.

## 2020-11-27 ENCOUNTER — Other Ambulatory Visit: Payer: Medicare PPO | Admitting: Internal Medicine

## 2020-11-27 ENCOUNTER — Other Ambulatory Visit: Payer: Self-pay

## 2020-11-27 DIAGNOSIS — E7800 Pure hypercholesterolemia, unspecified: Secondary | ICD-10-CM | POA: Diagnosis not present

## 2020-11-27 LAB — LIPID PANEL
Cholesterol: 195 mg/dL (ref <200)
HDL: 92 mg/dL (ref >=40)
LDL Cholesterol (Calc): 86 mg/dL (calc)
Non-HDL Cholesterol (Calc): 103 mg/dL (calc) (ref <130)
Total CHOL/HDL Ratio: 2.1 (calc) (ref <5.0)
Triglycerides: 80 mg/dL (ref <150)

## 2020-11-27 LAB — HEPATIC FUNCTION PANEL
AG Ratio: 1.9 (calc) (ref 1.0–2.5)
ALT: 21 U/L (ref 9–46)
AST: 16 U/L (ref 10–35)
Albumin: 4.2 g/dL (ref 3.6–5.1)
Alkaline phosphatase (APISO): 45 U/L (ref 35–144)
Bilirubin, Direct: 0.1 mg/dL (ref 0.0–0.2)
Globulin: 2.2 g/dL (calc) (ref 1.9–3.7)
Indirect Bilirubin: 0.4 mg/dL (calc) (ref 0.2–1.2)
Total Bilirubin: 0.5 mg/dL (ref 0.2–1.2)
Total Protein: 6.4 g/dL (ref 6.1–8.1)

## 2020-11-30 ENCOUNTER — Ambulatory Visit: Payer: Medicare PPO | Admitting: Internal Medicine

## 2020-11-30 ENCOUNTER — Other Ambulatory Visit: Payer: Self-pay

## 2020-11-30 ENCOUNTER — Encounter: Payer: Self-pay | Admitting: Internal Medicine

## 2020-11-30 VITALS — BP 120/60 | HR 74 | Ht 68.0 in | Wt 188.0 lb

## 2020-11-30 DIAGNOSIS — R809 Proteinuria, unspecified: Secondary | ICD-10-CM

## 2020-11-30 DIAGNOSIS — I10 Essential (primary) hypertension: Secondary | ICD-10-CM

## 2020-11-30 DIAGNOSIS — Z8616 Personal history of COVID-19: Secondary | ICD-10-CM

## 2020-11-30 NOTE — Progress Notes (Signed)
   Subjective:    Patient ID: Andrew Cole, male    DOB: 1946-10-23, 75 y.o.   MRN: [id number]  HPI 75 year old  Male who tested positive for COVID-19 October 29, 2020 with a home test after trip.  He only had mild symptoms and has been fully vaccinated.  Has fully recovered.  He was seen for health maintenance exam and Medicare wellness visit in December and it was noted that he had microalbumin in his urine.  He had also had microalbumin a year previously as well as 2020.  Prior to 2020 he did not have proteinuria but  had a trace of it on urine dipstick 5 or 6 years ago.  He has no history of diabetes mellitus.  I explained to him that I would like for him to do a 24-hour urine for protein and creatinine clearance.  He has a history of hypertension treated with losartan HCTZ and amlodipine.  He takes generic Crestor for hyperlipidemia.    Review of Systems he has no complaints     Objective:   Physical Exam  Vital signs reviewed.  No CVA tenderness.  Chest clear to auscultation.  Cardiac exam regular rate and rhythm.  No lower extremity edema.      Assessment & Plan:  History of proteinuria.  Total urine protein today random is 42 normal being between 5 and 25.  He will submit 24-hour urine for protein in the near future.

## 2020-12-01 LAB — HEMOGLOBIN A1C
Hgb A1c MFr Bld: 5.4 % of total Hgb (ref <5.7)
Mean Plasma Glucose: 108 mg/dL
eAG (mmol/L): 6 mmol/L

## 2020-12-04 ENCOUNTER — Other Ambulatory Visit: Payer: Self-pay | Admitting: Internal Medicine

## 2020-12-04 DIAGNOSIS — R809 Proteinuria, unspecified: Secondary | ICD-10-CM | POA: Diagnosis not present

## 2020-12-10 LAB — TEST AUTHORIZATION

## 2020-12-10 LAB — PROTEIN, URINE, 24 HOUR: Protein, 24H Urine: 1260 mg/24 h — ABNORMAL HIGH (ref 0–149)

## 2020-12-10 LAB — PROTEIN, URINE, RANDOM: Total Protein, Urine: 42 mg/dL — ABNORMAL HIGH (ref 5–25)

## 2021-01-02 ENCOUNTER — Other Ambulatory Visit: Payer: Self-pay | Admitting: Internal Medicine

## 2021-01-12 NOTE — Patient Instructions (Signed)
Patient to submit 24-hour urine for protein due to protein being detected in isolated urine specimens.

## 2021-01-14 ENCOUNTER — Telehealth: Payer: Self-pay

## 2021-01-14 NOTE — Telephone Encounter (Signed)
Needs 15 minute appt to talk about protein in urine, whenever he is available.

## 2021-01-17 ENCOUNTER — Ambulatory Visit (INDEPENDENT_AMBULATORY_CARE_PROVIDER_SITE_OTHER): Payer: Medicare PPO | Admitting: Internal Medicine

## 2021-01-17 ENCOUNTER — Other Ambulatory Visit: Payer: Self-pay

## 2021-01-17 ENCOUNTER — Encounter: Payer: Self-pay | Admitting: Internal Medicine

## 2021-01-17 VITALS — BP 120/60 | HR 75 | Ht 68.0 in | Wt 184.0 lb

## 2021-01-17 DIAGNOSIS — R809 Proteinuria, unspecified: Secondary | ICD-10-CM

## 2021-01-17 NOTE — Progress Notes (Signed)
   Subjective:    Patient ID: Andrew Cole, male    DOB: [birth year], 75 y.o.   MRN: [id number]  HPI   I asked him to come give urine specimen today for urine protein electrophoresis. He had 24 hour urine for protein in February and level was 1260 mg/24 hours.  Previously tested positive for urine microalbumin in December 2021. Hx HTN but no history of diabetes.  Had normal urine dipstick in November 2019, in 2017 and in 2015.  Hgb AIC Feb 4,.2022 was 5.4%.  Urine protein electrophoresis random shows total protein 221.  No abnormal protein bands (Bence-Jones proteins (detected.  Review of Systems no polyuria, no hematuria     Objective:   Physical Exam  Blood pressure 120/60, pulse 75, pulse oximetry 97% height 5 feet 8 inches weight 184 pounds BMI 27.98  No CVA tenderness.      Assessment & Plan:  Proteinuria- new is asymptomatic and urine for Bence Jones protein is negative.  Plan: Referral to [facility]. Called patient to explain and he has appt there next week/

## 2021-01-21 LAB — URINALYSIS, COMPLETE
Bacteria, UA: NONE SEEN /HPF
Bilirubin Urine: NEGATIVE
Glucose, UA: NEGATIVE
Hgb urine dipstick: NEGATIVE
Hyaline Cast: NONE SEEN /LPF
Nitrite: NEGATIVE
Specific Gravity, Urine: 1.024 (ref 1.001–1.03)
Squamous Epithelial / HPF: NONE SEEN /HPF (ref <=5)
WBC, UA: NONE SEEN /HPF (ref 0–5)
pH: 5.5 (ref 5.0–8.0)

## 2021-01-21 LAB — PROTEIN,TOTAL AND PROTEIN ELECTROPHORESIS, RANDOM URINE(REFL)
Albumin: 81 %
Alpha-1-Globulin, U: 3 %
Alpha-2-Globulin, U: 4 %
Beta Globulin, U: 8 %
Creatinine, Urine: 192 mg/dL (ref 20–320)
Gamma Globulin, U: 4 %
Protein/Creat Ratio: 1151 mg/g creat — ABNORMAL HIGH (ref 22–128)
Protein/Creatinine Ratio: 1.151 mg/mg creat — ABNORMAL HIGH (ref 0.022–0.12)
Total Protein, Urine: 221 mg/dL — ABNORMAL HIGH (ref 5–25)

## 2021-01-21 NOTE — Progress Notes (Signed)
Referral placed.

## 2021-01-25 NOTE — Patient Instructions (Signed)
Referral to [facility] for proteinuria.

## 2021-01-30 DIAGNOSIS — I129 Hypertensive chronic kidney disease with stage 1 through stage 4 chronic kidney disease, or unspecified chronic kidney disease: Secondary | ICD-10-CM | POA: Diagnosis not present

## 2021-01-30 DIAGNOSIS — R809 Proteinuria, unspecified: Secondary | ICD-10-CM | POA: Diagnosis not present

## 2021-02-01 ENCOUNTER — Other Ambulatory Visit: Payer: Self-pay | Admitting: Nephrology

## 2021-02-01 DIAGNOSIS — I129 Hypertensive chronic kidney disease with stage 1 through stage 4 chronic kidney disease, or unspecified chronic kidney disease: Secondary | ICD-10-CM

## 2021-02-01 DIAGNOSIS — R809 Proteinuria, unspecified: Secondary | ICD-10-CM

## 2021-02-04 ENCOUNTER — Ambulatory Visit
Admission: RE | Admit: 2021-02-04 | Discharge: 2021-02-04 | Disposition: A | Discharge status: Home / Self Care | Payer: Medicare PPO | Source: Ambulatory Visit | Attending: Nephrology | Admitting: Nephrology

## 2021-02-04 DIAGNOSIS — R809 Proteinuria, unspecified: Secondary | ICD-10-CM | POA: Diagnosis not present

## 2021-02-04 DIAGNOSIS — I129 Hypertensive chronic kidney disease with stage 1 through stage 4 chronic kidney disease, or unspecified chronic kidney disease: Secondary | ICD-10-CM

## 2021-02-04 DIAGNOSIS — I10 Essential (primary) hypertension: Secondary | ICD-10-CM | POA: Diagnosis not present

## 2021-02-04 DIAGNOSIS — N289 Disorder of kidney and ureter, unspecified: Secondary | ICD-10-CM | POA: Diagnosis not present

## 2021-02-18 ENCOUNTER — Other Ambulatory Visit: Payer: Self-pay | Admitting: Internal Medicine

## 2021-03-27 DIAGNOSIS — E559 Vitamin D deficiency, unspecified: Secondary | ICD-10-CM | POA: Diagnosis not present

## 2021-03-27 DIAGNOSIS — I129 Hypertensive chronic kidney disease with stage 1 through stage 4 chronic kidney disease, or unspecified chronic kidney disease: Secondary | ICD-10-CM | POA: Diagnosis not present

## 2021-04-02 DIAGNOSIS — R809 Proteinuria, unspecified: Secondary | ICD-10-CM | POA: Diagnosis not present

## 2021-04-02 DIAGNOSIS — I129 Hypertensive chronic kidney disease with stage 1 through stage 4 chronic kidney disease, or unspecified chronic kidney disease: Secondary | ICD-10-CM | POA: Diagnosis not present

## 2021-04-02 DIAGNOSIS — N182 Chronic kidney disease, stage 2 (mild): Secondary | ICD-10-CM | POA: Diagnosis not present

## 2021-05-29 DIAGNOSIS — H3562 Retinal hemorrhage, left eye: Secondary | ICD-10-CM | POA: Diagnosis not present

## 2021-05-29 DIAGNOSIS — H5203 Hypermetropia, bilateral: Secondary | ICD-10-CM | POA: Diagnosis not present

## 2021-05-29 DIAGNOSIS — H35033 Hypertensive retinopathy, bilateral: Secondary | ICD-10-CM | POA: Diagnosis not present

## 2021-05-29 DIAGNOSIS — H524 Presbyopia: Secondary | ICD-10-CM | POA: Diagnosis not present

## 2021-05-29 DIAGNOSIS — H35371 Puckering of macula, right eye: Secondary | ICD-10-CM | POA: Diagnosis not present

## 2021-05-29 DIAGNOSIS — Z961 Presence of intraocular lens: Secondary | ICD-10-CM | POA: Diagnosis not present

## 2021-06-27 DIAGNOSIS — M19042 Primary osteoarthritis, left hand: Secondary | ICD-10-CM | POA: Diagnosis not present

## 2021-06-27 DIAGNOSIS — M65331 Trigger finger, right middle finger: Secondary | ICD-10-CM | POA: Diagnosis not present

## 2021-06-27 DIAGNOSIS — M19041 Primary osteoarthritis, right hand: Secondary | ICD-10-CM | POA: Diagnosis not present

## 2021-06-27 DIAGNOSIS — M1810 Unilateral primary osteoarthritis of first carpometacarpal joint, unspecified hand: Secondary | ICD-10-CM | POA: Diagnosis not present

## 2021-06-27 DIAGNOSIS — M65342 Trigger finger, left ring finger: Secondary | ICD-10-CM | POA: Diagnosis not present

## 2021-07-09 ENCOUNTER — Other Ambulatory Visit: Payer: Self-pay | Admitting: Internal Medicine

## 2021-07-24 ENCOUNTER — Other Ambulatory Visit: Payer: Self-pay | Admitting: Internal Medicine

## 2021-08-01 ENCOUNTER — Ambulatory Visit: Payer: Medicare PPO | Admitting: Internal Medicine

## 2021-08-01 ENCOUNTER — Other Ambulatory Visit: Payer: Self-pay

## 2021-08-01 VITALS — BP 132/70 | HR 72 | Temp 99.3°F | Ht 68.0 in | Wt 184.0 lb

## 2021-08-01 DIAGNOSIS — R059 Cough, unspecified: Secondary | ICD-10-CM

## 2021-08-01 DIAGNOSIS — I10 Essential (primary) hypertension: Secondary | ICD-10-CM

## 2021-08-01 DIAGNOSIS — J22 Unspecified acute lower respiratory infection: Secondary | ICD-10-CM | POA: Diagnosis not present

## 2021-08-01 MED ORDER — PREDNISONE 10 MG PO TABS: ORAL_TABLET | ORAL | 0 refills | Status: DC

## 2021-08-01 MED ORDER — AZITHROMYCIN 250 MG PO TABS: ORAL_TABLET | ORAL | 0 refills | Status: AC

## 2021-08-23 ENCOUNTER — Encounter: Payer: Self-pay | Admitting: Internal Medicine

## 2021-08-23 NOTE — Progress Notes (Signed)
   Subjective:    Patient ID: Andrew Cole, male    DOB: June 23, 1946, 75 y.o.   MRN: [id number]  HPI 75 year old Male seen today acutely.  He says that on August 7 he felt tired and slept a lot.  Subsequently developed cough and nasal congestion after that.  Says he has a wheeze at night.  Tested negative for COVID 2 days ago.  He wondered if he had COVID in August.  Recently traveled to [place].  Denies headache and sore throat.  Has used inhaler several times in the past week but only generally uses it about 4 times a year.    Review of Systems see above     Objective:   Physical Exam Vital signs reviewed blood pressure 132/70 temperature 99.3 degrees pulse oximetry 96% pulse 72 and regular  Skin: Warm and dry.  No cervical adenopathy.  Pharynx is clear.  TMs clear.  Neck supple.  Chest clear.       Assessment & Plan:  Acute lower respiratory infection  Plan: Zithromax Z-PAK 2 tabs day 1 followed by 1 tab days 2 through 5.  Prednisone 10 mg starting with 6 tablets day 1 and decreasing by 1 tablet daily i.e. 6-5-4-3-2-1 taper.  Call if not better in 48 to 72 hours or sooner if worse.

## 2021-09-24 ENCOUNTER — Other Ambulatory Visit: Payer: Self-pay

## 2021-09-24 ENCOUNTER — Other Ambulatory Visit: Payer: Medicare PPO | Admitting: Internal Medicine

## 2021-09-24 DIAGNOSIS — I10 Essential (primary) hypertension: Secondary | ICD-10-CM | POA: Diagnosis not present

## 2021-09-24 DIAGNOSIS — R972 Elevated prostate specific antigen [PSA]: Secondary | ICD-10-CM | POA: Diagnosis not present

## 2021-09-24 DIAGNOSIS — E7800 Pure hypercholesterolemia, unspecified: Secondary | ICD-10-CM | POA: Diagnosis not present

## 2021-09-25 LAB — CBC WITH DIFFERENTIAL/PLATELET
Absolute Monocytes: 850 cells/uL (ref 200–950)
Basophils Absolute: 30 cells/uL (ref 0–200)
Basophils Relative: 0.5 %
Eosinophils Absolute: 218 cells/uL (ref 15–500)
Eosinophils Relative: 3.7 %
HCT: 42.1 % (ref 38.5–50.0)
Hemoglobin: 14.3 g/dL (ref 13.2–17.1)
Lymphs Abs: 2572 cells/uL (ref 850–3900)
MCH: 33.6 pg — ABNORMAL HIGH (ref 27.0–33.0)
MCHC: 34 g/dL (ref 32.0–36.0)
MCV: 99.1 fL (ref 80.0–100.0)
MPV: 9 fL (ref 7.5–12.5)
Monocytes Relative: 14.4 %
Neutro Abs: 2230 cells/uL (ref 1500–7800)
Neutrophils Relative %: 37.8 %
Platelets: 222 10*3/uL (ref 140–400)
RBC: 4.25 10*6/uL (ref 4.20–5.80)
RDW: 12.4 % (ref 11.0–15.0)
Total Lymphocyte: 43.6 %
WBC: 5.9 10*3/uL (ref 3.8–10.8)

## 2021-09-25 LAB — COMPLETE METABOLIC PANEL WITH GFR
AG Ratio: 2 (calc) (ref 1.0–2.5)
ALT: 26 U/L (ref 9–46)
AST: 21 U/L (ref 10–35)
Albumin: 4.3 g/dL (ref 3.6–5.1)
Alkaline phosphatase (APISO): 46 U/L (ref 35–144)
BUN: 23 mg/dL (ref 7–25)
CO2: 28 mmol/L (ref 20–32)
Calcium: 9.7 mg/dL (ref 8.6–10.3)
Chloride: 103 mmol/L (ref 98–110)
Creat: 0.96 mg/dL (ref 0.70–1.28)
Globulin: 2.2 g/dL (calc) (ref 1.9–3.7)
Glucose, Bld: 86 mg/dL (ref 65–99)
Potassium: 4 mmol/L (ref 3.5–5.3)
Sodium: 141 mmol/L (ref 135–146)
Total Bilirubin: 0.4 mg/dL (ref 0.2–1.2)
Total Protein: 6.5 g/dL (ref 6.1–8.1)
eGFR: 83 mL/min/{1.73_m2} (ref >=60)

## 2021-09-25 LAB — LIPID PANEL
Cholesterol: 198 mg/dL (ref <200)
HDL: 82 mg/dL (ref >=40)
LDL Cholesterol (Calc): 101 mg/dL (calc) — ABNORMAL HIGH
Non-HDL Cholesterol (Calc): 116 mg/dL (calc) (ref <130)
Total CHOL/HDL Ratio: 2.4 (calc) (ref <5.0)
Triglycerides: 60 mg/dL (ref <150)

## 2021-09-25 LAB — PSA: PSA: 2.01 ng/mL (ref <=4.00)

## 2021-09-30 ENCOUNTER — Ambulatory Visit (INDEPENDENT_AMBULATORY_CARE_PROVIDER_SITE_OTHER): Payer: Medicare PPO | Admitting: Internal Medicine

## 2021-09-30 ENCOUNTER — Encounter: Payer: Self-pay | Admitting: Internal Medicine

## 2021-09-30 ENCOUNTER — Other Ambulatory Visit: Payer: Self-pay

## 2021-09-30 VITALS — BP 128/70 | HR 70 | Ht 66.0 in | Wt 190.0 lb

## 2021-09-30 DIAGNOSIS — Z0000 Encounter for general adult medical examination without abnormal findings: Secondary | ICD-10-CM

## 2021-09-30 DIAGNOSIS — R809 Proteinuria, unspecified: Secondary | ICD-10-CM | POA: Diagnosis not present

## 2021-09-30 DIAGNOSIS — H6122 Impacted cerumen, left ear: Secondary | ICD-10-CM | POA: Diagnosis not present

## 2021-09-30 DIAGNOSIS — R0683 Snoring: Secondary | ICD-10-CM | POA: Diagnosis not present

## 2021-09-30 DIAGNOSIS — I10 Essential (primary) hypertension: Secondary | ICD-10-CM | POA: Diagnosis not present

## 2021-09-30 DIAGNOSIS — E7800 Pure hypercholesterolemia, unspecified: Secondary | ICD-10-CM | POA: Diagnosis not present

## 2021-09-30 DIAGNOSIS — Z86010 Personal history of colonic polyps: Secondary | ICD-10-CM

## 2021-09-30 DIAGNOSIS — N401 Enlarged prostate with lower urinary tract symptoms: Secondary | ICD-10-CM | POA: Diagnosis not present

## 2021-09-30 LAB — POCT URINALYSIS DIPSTICK
Appearance: NORMAL
Bilirubin, UA: NEGATIVE
Blood, UA: NEGATIVE
Glucose, UA: NEGATIVE
Ketones, UA: NEGATIVE
Leukocytes, UA: NEGATIVE
Nitrite, UA: NEGATIVE
Protein, UA: POSITIVE — AB
Spec Grav, UA: 1.01 (ref 1.010–1.025)
Urobilinogen, UA: 0.2 E.U./dL
pH, UA: 7.5 (ref 5.0–8.0)

## 2021-09-30 NOTE — Patient Instructions (Addendum)
I will send [facility] these lab results. You may call them for follow up appt. Colonoscopy due.  He will call Dr. Earlean Shawl. Please call us with your  recent flu vaccine and Covid vaccine dates.  We do not have them on file. Pulmonary referral for possible sleep apnea. Urology referral for nocturia and right side of prostate enlarged with normal PSA. ENT referral for left ear wax impaction.

## 2021-09-30 NOTE — Progress Notes (Signed)
Subjective:    Patient ID: Andrew Cole, male    DOB: 1946/10/20, 75 y.o.   MRN: [id number]  HPI  75 year old Male seen for health maintenance exam and evaluation of medical issues.   Seen at [facility] in April and June. He is not sure when his follow up is. Will send results of his labs today to that office and he should call them to see when follow up is due.  Has been told he has umbilical hernia by acquaintance with medical training but is asymptomatic.   Has snoring and wife thinks he has apnea. Will make referral to Pulmonary for evaluation. This is a new complaint today. Wife thinks he has had this for years.  Tdap is up to date. Pneumococcal vaccines are up to date.   Review of Systems Needs repeat colonoscopy- he needs to call Dr. Earlean Shawl.  Recently saw Dr. Burney Gauze regarding trigger finger of right middle finger and had injection.  Does not think it helped very much.  He may need to contact Dr. Burney Gauze for another injection or discuss alternatives.     Objective:   Physical Exam    BP 128/70, pulse 70 regular, Pulse ox 97% Weight 190 pounds BMI 30.67 Skin: Warm and dry.  Nodes none.  Impacted cerumen distal left external ear canal.  Will be referred to ENT.  Right TM clear.  Neck is supple without JVD thyromegaly or carotid bruits.  Chest is clear to auscultation without rales or wheezing.  Cardiac exam: Regular rate and rhythm without ectopy or murmur.  Abdomen: Soft nondistended without hepatosplenomegaly masses or tenderness.  He has a small reducible umbilical hernia.  Prostate exam: Right lobe is considerably larger than the left lobe but nontender and not boggy.  His PSA is normal.     Assessment & Plan:  Nocturia/hesitancy-referral to Urology.  Right lobe of prostate is larger than left lobe.  PSA is normal.  Referral to [facility].?  BPH  Colonoscopy due-patient should call Dr. Earlean Shawl for appointment  Being followed for chronic kidney disease at  [place] will be faxed to them and these are stable.  Patient should call to inquire when follow-up there is needed.  Patient recently had flu vaccine and COVID booster at pharmacy but we do not have those dates.  He will call us with that information.  Impacted cerumen left external ear canal-referral to [facility] for extraction of cerumen.  Asymptomatic small umbilical hernia-continue to observe  Immunizations-patient is to call us with dates of recent COVID booster and flu vaccine.  History of snoring and possible apnea-referral to Pulmonary  Plan: He is to return in 1 year or as needed.  Subjective:   Patient presents for Medicare Annual/Subsequent preventive examination.  Review Past Medical/Family/Social: See above  Risk Factors  Current exercise habits: some light exercise Dietary issues discussed: low fat low carb discussed  Cardiac risk factors:hyperlipidemia  Depression Screen  (Note: if answer to either of the following is "Yes", a more complete depression screening is indicated)   Over the past two weeks, have you felt down, depressed or hopeless? No  Over the past two weeks, have you felt little interest or pleasure in doing things? No Have you lost interest or pleasure in daily life? No Do you often feel hopeless? No Do you cry easily over simple problems? No   Activities of Daily Living  In your present state of health, do you have any  difficulty performing the following activities?:   Driving? No  Managing money? No  Feeding yourself? No  Getting from bed to chair? No  Climbing a flight of stairs? No  Preparing food and eating?: No  Bathing or showering? No  Getting dressed: No  Getting to the toilet? No  Using the toilet:No  Moving around from place to place: No  In the past year have you fallen or had a near fall?:No  Are you sexually active? yes Do you have more than one partner? No   Hearing Difficulties: No  Do you  often ask people to speak up or repeat themselves? No  Do you experience ringing or noises in your ears? No  Do you have difficulty understanding soft or whispered voices? No  Do you feel that you have a problem with memory? No Do you often misplace items? No    Home Safety:  Do you have a smoke alarm at your residence? Yes Do you have grab bars in the bathroom? no Do you have throw rugs in your house?no   Cognitive Testing  Alert? Yes Normal Appearance?Yes  Oriented to person? Yes Place? Yes  Time? Yes  Recall of three objects? Yes  Can perform simple calculations? Yes  Displays appropriate judgment?Yes  Can read the correct time from a watch face?Yes   List the Names of Other Physician/Practitioners you currently use:  See referral list for the physicians patient is currently seeing.   [facility]- sub nephrotic range proteinuria with extensive negative workup treated with losartan  Review of Systems:see above   Objective:     General appearance: Appears stated age and  fit Head: Normocephalic, without obvious abnormality, atraumatic  Eyes: conj clear, EOMi PEERLA  Ears: normal TM's and external ear canals both ears  Nose: Nares normal. Septum midline. Mucosa normal. No drainage or sinus tenderness.  Throat: lips, mucosa, and tongue normal; teeth and gums normal  Neck: no adenopathy, no carotid bruit, no JVD, supple, symmetrical, trachea midline and thyroid not enlarged, symmetric, no tenderness/mass/nodules  No CVA tenderness.  Lungs: clear to auscultation bilaterally  Breasts: normal appearance, no masses or tenderness, top of the pacemaker on left upper chest. Incision well-healed. It is tender.  Heart: regular rate and rhythm, S1, S2 normal, no murmur, click, rub or gallop  Abdomen: soft, non-tender; bowel sounds normal; no masses, no organomegaly  Musculoskeletal: ROM normal in all joints, no crepitus, no deformity, Normal muscle strengthen. Back  is  symmetric, no curvature. Skin: Skin color, texture, turgor normal. No rashes or lesions  Lymph nodes: Cervical, supraclavicular, and axillary nodes normal.  Neurologic: CN 2 -12 Normal, Normal symmetric reflexes. Normal coordination and gait  Psych: Alert & Oriented x 3, Mood appear stable.    Assessment:    Annual wellness medicare exam   Plan:    During the course of the visit the patient was educated and counseled about appropriate screening and preventive services including:   See above- had flu vaccine in October and Covid booster. Tdap due 2023     Patient Instructions (the written plan) was given to the patient.  Medicare Attestation  I have personally reviewed:  The patient's medical and social history  Their use of alcohol, tobacco or illicit drugs  Their current medications and supplements  The patient's functional ability including ADLs,fall risks, home safety risks, cognitive, and hearing and visual impairment  Diet and physical activities  Evidence for depression or mood disorders  The patient's weight,  height, BMI, and visual acuity have been recorded in the chart. I have made referrals, counseling, and provided education to the patient based on review of the above and I have provided the patient with a written personalized care plan for preventive services.

## 2021-10-02 ENCOUNTER — Other Ambulatory Visit: Payer: Self-pay

## 2021-10-02 ENCOUNTER — Ambulatory Visit (INDEPENDENT_AMBULATORY_CARE_PROVIDER_SITE_OTHER): Payer: Medicare PPO

## 2021-10-02 DIAGNOSIS — Z0000 Encounter for general adult medical examination without abnormal findings: Secondary | ICD-10-CM | POA: Diagnosis not present

## 2021-10-02 NOTE — Progress Notes (Addendum)
IElby Showers, MD, have reviewed all documentation for this visit. The documentation on 10/02/21 for the exam, diagnosis, procedures, and orders are all accurate and complete.   Subjective:  I connected with  Andrew Cole on 10/02/21 by a audio enabled telemedicine application and verified that I am speaking with the correct person using two identifiers.  Patient Location: Home  Provider Location: Office/Clinic  I discussed the limitations of evaluation and management by telemedicine. The patient expressed understanding and agreed to proceed.    Andrew Cole is a 75 y.o. male who presents for Medicare Annual/Subsequent preventive examination.  Review of Systems    Defer to PCP       Objective:    There were no vitals filed for this visit. There is no height or weight on file to calculate BMI.  Advanced Directives 10/02/2021 10/30/2016  Does Patient Have a Medical Advance Directive? Yes No  Type of Paramedic of [place];Living will -  Does patient want to make changes to medical advance directive? No - Patient declined -  Copy of [place] in Chart? No - copy requested -  Would patient like information on creating a medical advance directive? - No - Patient declined    Current Medications (verified) Outpatient Encounter Medications as of 10/02/2021  Medication Sig   albuterol (PROAIR HFA) 108 (90 Base) MCG/ACT inhaler INHALE TWO PUFFS BY MOUTH EVERY 6 HOURS AS NEEDED FOR WHEEZING OR SHORTNESS OF BREATH   ALPRAZolam (XANAX) 0.5 MG tablet TAKE 1 TABLET BY MOUTH 2 TIMES DAILY AS NEEDED.   amLODipine (NORVASC) 5 MG tablet TAKE 1 TABLET EVERY DAY   clotrimazole-betamethasone (LOTRISONE) cream USE ON BUTTOCKS TWICE DAILY.   fluticasone (FLONASE) 50 MCG/ACT nasal spray Use 2 sprays in each  nostril daily   hydrocortisone (PROCTO-MED HC) 2.5 % rectal cream Place 1 application rectally 2 (two) times daily.   hydrocortisone-pramoxine (ANALPRAM-HC)  2.5-1 % rectal cream Place 1 application rectally 3 (three) times daily.   losartan-hydrochlorothiazide (HYZAAR) 100-25 MG tablet TAKE 1 TABLET EVERY DAY   rosuvastatin (CRESTOR) 10 MG tablet TAKE 1 TABLET EVERY DAY   No facility-administered encounter medications on file as of 10/02/2021.    Allergies (verified) Tessalon [benzonatate], Penicillins, and Zocor [simvastatin]   History: Past Medical History:  Diagnosis Date   Adenomatous polyps    Allergy    Asthma    Hyperlipidemia    Hypertension    Impotence    Past Surgical History:  Procedure Laterality Date   CATARACT EXTRACTION, BILATERAL Bilateral 04/2016   Family History  Problem Relation Age of Onset   Cancer Mother    Cancer Father    Social History   Socioeconomic History   Marital status: Married    Spouse name: Not on file   Number of children: Not on file   Years of education: Not on file   Highest education level: Not on file  Occupational History   Not on file  Tobacco Use   Smoking status: Former   Smokeless tobacco: Never  Substance and Sexual Activity   Alcohol use: Yes    Alcohol/week: 1.0 - 2.0 standard drink    Types: 1 - 2 Glasses of wine per week   Drug use: No   Sexual activity: Not on file  Other Topics Concern   Not on file  Social History Narrative   Not on file   Social Determinants of Health   Financial Resource Strain:  Low Risk    Difficulty of Paying Living Expenses: Not hard at all  Food Insecurity: No Food Insecurity   Worried About Running Out of Food in the Last Year: Never true   Ran Out of Food in the Last Year: Never true  Transportation Needs: No Transportation Needs   Lack of Transportation (Medical): No   Lack of Transportation (Non-Medical): No  Physical Activity: Sufficiently Active   Days of Exercise per Week: 7 days   Minutes of Exercise per Session: 60 min  Stress: No Stress Concern Present   Feeling of Stress : Not at all  Social Connections: Moderately  Isolated   Frequency of Communication with Friends and Family: More than three times a week   Frequency of Social Gatherings with Friends and Family: Three times a week   Attends Religious Services: Never   Active Member of Clubs or Organizations: No   Attends Music therapist: Never   Marital Status: Married    Tobacco Counseling Counseling given: Not Answered   Clinical Intake:  Pre-visit preparation completed: Yes  Pain : No/denies pain     Nutritional Risks: None Diabetes: No  How often do you need to have someone help you when you read instructions, pamphlets, or other written materials from your doctor or pharmacy?: 1 - Never What is the last grade level you completed in school?: graduate degree  Diabetic?no  Interpreter Needed?: No      Activities of Daily Living In your present state of health, do you have any difficulty performing the following activities: 09/30/2021  Hearing? N  Vision? N  Difficulty concentrating or making decisions? N  Walking or climbing stairs? N  Dressing or bathing? N  Doing errands, shopping? N  Some recent data might be hidden    Patient Care Team: Baxley, Cresenciano Lick, MD as PCP - General (Internal Medicine)  Indicate any recent Medical Services you may have received from other than [facility] providers in the past year (date may be approximate).     Assessment:   This is a routine wellness examination for Andrew Cole.  Hearing/Vision screen No results found.  Dietary issues and exercise activities discussed:     Goals Addressed   None   Depression Screen PHQ 2/9 Scores 10/02/2021 09/30/2021 09/28/2020 09/16/2019 09/10/2018 08/24/2017 08/22/2016  PHQ - 2 Score 0 0 0 0 0 0 0    Fall Risk Fall Risk  09/30/2021 09/28/2020 09/16/2019 09/10/2018 08/24/2017  Falls in the past year? 0 0 0 0 No  Number falls in past yr: 0 0 - - -  Injury with Fall? 0 0 - - -  Follow up - Falls evaluation completed - - -    FALL RISK PREVENTION  PERTAINING TO THE HOME:  Any stairs in or around the home? Yes  If so, are there any without handrails? No  Home free of loose throw rugs in walkways, pet beds, electrical cords, etc? No  Adequate lighting in your home to reduce risk of falls? Yes   ASSISTIVE DEVICES UTILIZED TO PREVENT FALLS:  Life alert? No  Use of a cane, walker or w/c? No  Grab bars in the bathroom? No  Shower chair or bench in shower? No  Elevated toilet seat or a handicapped toilet? No   TIMED UP AND GO:  Was the test performed? No .  Length of time to ambulate 10 feet: N/A sec.     Cognitive Function:     6CIT Screen 10/02/2021  What Year? 0 points  What month? 0 points  What time? 0 points  Count back from 20 0 points  Months in reverse 0 points  Repeat phrase 0 points  Total Score 0    Immunizations Immunization History  Administered Date(s) Administered   Fluad Quad(high Dose 65+) 07/21/2019   Influenza Split 07/30/2011, 07/28/2012   Influenza, High Dose Seasonal PF 07/17/2018, 07/19/2020   Influenza,inj,Quad PF,6+ Mos 08/17/2013, 07/20/2014, 08/20/2015, 08/22/2016, 07/14/2017, 07/21/2019   Influenza-Unspecified 08/26/2021   PFIZER(Purple Top)SARS-COV-2 Vaccination 11/29/2019, 12/17/2019, 06/14/2020   Pfizer Covid-19 Vaccine Bivalent Booster 73yrs & up 08/26/2021   Pneumococcal Conjugate-13 08/20/2015   Pneumococcal Polysaccharide-23 01/19/2014   Tdap 04/13/2012   Zoster Recombinat (Shingrix) 06/27/2018, 10/04/2018    TDAP status: Up to date  Flu Vaccine status: Up to date  Pneumococcal vaccine status: Up to date  Covid-19 vaccine status: Information provided on how to obtain vaccines.   Qualifies for Shingles Vaccine? Yes   Zostavax completed Yes   Shingrix Completed?: Yes  Screening Tests Health Maintenance  Topic Date Due   COLONOSCOPY (Pts 45-65yrs Insurance coverage will need to be confirmed)  04/13/2021   TETANUS/TDAP  04/13/2022   Pneumonia Vaccine 11+ Years old   Completed   INFLUENZA VACCINE  Completed   COVID-19 Vaccine  Completed   Hepatitis C Screening  Completed   Zoster Vaccines- Shingrix  Completed   HPV VACCINES  Aged Out    Health Maintenance  Health Maintenance Due  Topic Date Due   COLONOSCOPY (Pts 45-28yrs Insurance coverage will need to be confirmed)  04/13/2021    Colorectal cancer screening: Type of screening: Colonoscopy. Completed 04/13/2018. Repeat every 3 years  Lung Cancer Screening: (Low Dose CT Chest recommended if Age 38-80 years, 30 pack-year currently smoking OR have quit w/in 15years.) does not qualify.   Lung Cancer Screening Referral: n/a  Additional Screening:  Hepatitis C Screening: does qualify; Completed 10/09/2011  Vision Screening: Recommended annual ophthalmology exams for early detection of glaucoma and other disorders of the eye. Is the patient up to date with their annual eye exam?  Yes  Who is the provider or what is the name of the office in which the patient attends annual eye exams? Dr [facility] If pt is not established with a provider, would they like to be referred to a provider to establish care?  N/a .   Dental Screening: Recommended annual dental exams for proper oral hygiene  Community Resource Referral / Chronic Care Management: CRR required this visit?  No   CCM required this visit?  No      Plan:     I have personally reviewed and noted the following in the patient's chart:   Medical and social history Use of alcohol, tobacco or illicit drugs  Current medications and supplements including opioid prescriptions. Patient is not currently taking opioid prescriptions. Functional ability and status Nutritional status Physical activity Advanced directives List of other physicians Hospitalizations, surgeries, and ER visits in previous 12 months Vitals Screenings to include cognitive, depression, and falls Referrals and appointments  In addition, I have reviewed and  discussed with patient certain preventive protocols, quality metrics, and best practice recommendations. A written personalized care plan for preventive services as well as general preventive health recommendations were provided to patient.     Angus Seller, CMA   10/02/2021   Nurse Notes: Non face to face 30 minutes  Andrew Cole , Thank you for taking time to come for your  Medicare Wellness Visit. I appreciate your ongoing commitment to your health goals. Please review the following plan we discussed and let me know if I can assist you in the future.   These are the goals we discussed:  Goals   None     This is a list of the screening recommended for you and due dates:  Health Maintenance  Topic Date Due   Colon Cancer Screening  04/13/2021   Tetanus Vaccine  04/13/2022   Pneumonia Vaccine  Completed   Flu Shot  Completed   COVID-19 Vaccine  Completed   Hepatitis C Screening: USPSTF Recommendation to screen - Ages 18-79 yo.  Completed   Zoster (Shingles) Vaccine  Completed   HPV Vaccine  Aged Out

## 2021-10-29 DIAGNOSIS — N182 Chronic kidney disease, stage 2 (mild): Secondary | ICD-10-CM | POA: Diagnosis not present

## 2021-10-29 DIAGNOSIS — E559 Vitamin D deficiency, unspecified: Secondary | ICD-10-CM | POA: Diagnosis not present

## 2021-11-05 DIAGNOSIS — N182 Chronic kidney disease, stage 2 (mild): Secondary | ICD-10-CM | POA: Diagnosis not present

## 2021-11-05 DIAGNOSIS — I129 Hypertensive chronic kidney disease with stage 1 through stage 4 chronic kidney disease, or unspecified chronic kidney disease: Secondary | ICD-10-CM | POA: Diagnosis not present

## 2021-11-05 DIAGNOSIS — R809 Proteinuria, unspecified: Secondary | ICD-10-CM | POA: Diagnosis not present

## 2021-11-07 DIAGNOSIS — Z87891 Personal history of nicotine dependence: Secondary | ICD-10-CM | POA: Diagnosis not present

## 2021-11-07 DIAGNOSIS — H6122 Impacted cerumen, left ear: Secondary | ICD-10-CM | POA: Diagnosis not present

## 2021-11-07 DIAGNOSIS — H938X1 Other specified disorders of right ear: Secondary | ICD-10-CM | POA: Diagnosis not present

## 2021-11-14 ENCOUNTER — Other Ambulatory Visit: Payer: Self-pay | Admitting: Internal Medicine

## 2021-11-14 ENCOUNTER — Telehealth: Payer: Self-pay | Admitting: Internal Medicine

## 2021-11-14 NOTE — Telephone Encounter (Signed)
Andrew Cole [phone]  Andrew Cole has decided he would like a little something to help him sleep. He said again Thank You for reaching out to him.

## 2021-11-14 NOTE — Telephone Encounter (Signed)
Called patient to let him know what Dr Renold Genta said and he verbalized understanding

## 2021-12-02 ENCOUNTER — Other Ambulatory Visit: Payer: Self-pay | Admitting: Internal Medicine

## 2021-12-09 ENCOUNTER — Other Ambulatory Visit: Payer: Self-pay | Admitting: Internal Medicine

## 2021-12-09 DIAGNOSIS — R972 Elevated prostate specific antigen [PSA]: Secondary | ICD-10-CM | POA: Diagnosis not present

## 2022-01-07 DIAGNOSIS — D122 Benign neoplasm of ascending colon: Secondary | ICD-10-CM | POA: Diagnosis not present

## 2022-01-07 DIAGNOSIS — K635 Polyp of colon: Secondary | ICD-10-CM | POA: Diagnosis not present

## 2022-01-07 DIAGNOSIS — D125 Benign neoplasm of sigmoid colon: Secondary | ICD-10-CM | POA: Diagnosis not present

## 2022-01-07 DIAGNOSIS — Z1211 Encounter for screening for malignant neoplasm of colon: Secondary | ICD-10-CM | POA: Diagnosis not present

## 2022-01-07 DIAGNOSIS — D120 Benign neoplasm of cecum: Secondary | ICD-10-CM | POA: Diagnosis not present

## 2022-01-07 DIAGNOSIS — Z86010 Personal history of colonic polyps: Secondary | ICD-10-CM | POA: Diagnosis not present

## 2022-04-11 ENCOUNTER — Ambulatory Visit (INDEPENDENT_AMBULATORY_CARE_PROVIDER_SITE_OTHER): Payer: Medicare PPO

## 2022-04-11 ENCOUNTER — Ambulatory Visit: Payer: Self-pay

## 2022-04-11 ENCOUNTER — Ambulatory Visit: Payer: Medicare PPO | Admitting: Family Medicine

## 2022-04-11 VITALS — BP 138/72 | HR 73 | Ht 66.0 in | Wt 183.2 lb

## 2022-04-11 DIAGNOSIS — M1611 Unilateral primary osteoarthritis, right hip: Secondary | ICD-10-CM | POA: Diagnosis not present

## 2022-04-11 DIAGNOSIS — G8929 Other chronic pain: Secondary | ICD-10-CM

## 2022-04-11 DIAGNOSIS — M25551 Pain in right hip: Secondary | ICD-10-CM | POA: Diagnosis not present

## 2022-04-11 NOTE — Patient Instructions (Addendum)
Thank you for coming in today.   You received an injection today. Seek immediate medical attention if the joint becomes red, extremely painful, or is oozing fluid.   I've referred you to Orthopedic Surgery for a consultation.  Let us know if you don't hear from them in one week.   Check back with me as needed 

## 2022-04-11 NOTE — Progress Notes (Signed)
I, Andrew Cole, LAT, ATC acting as a scribe for Andrew Leader, MD.  Andrew Cole is a 76 y.o. male who presents to [place] at [facility] today for R thigh and groin pain ongoing since March w/ no MOI. Pt reports pain will vary between the R thigh and R groin. Pt reports R foot is more ER than the L leg.  Low back pain: not currently- 8 months ago R low back/SI region Radiates: no LE numbness/tingling: no Aggravates: forwarded bending, hip flexion Treatments tried: muscle relaxer, IBU PM,   Dx imaging: 05/05/17 L-spine XR  Pertinent review of systems: No fevers or chills  Relevant historical information: Hypertension.  Hyperlipidemia. widower.  His wife died in December 04, 2022.   Exam:  BP 138/72   Pulse 73   Ht '5\' 6"'$  (1.676 m)   Wt 183 lb 3.2 oz (83.1 kg)   SpO2 96%   BMI 29.57 kg/m  General: Well Developed, well nourished, and in no acute distress.   MSK: Right hip: Normal-appearing Decreased range of motion limited internal rotation and flexion with pain. Intact strength to hip motion within limits of range of motion.    Lab and Radiology Results  Procedure: Real-time Ultrasound Guided Injection of right hip femoral acetabular joint anterior approach Device: Philips Affiniti 50G Images permanently stored and available for review in PACS Verbal informed consent obtained.  Discussed risks and benefits of procedure. Warned about infection, bleeding, hyperglycemia damage to structures among others. Patient expresses understanding and agreement Time-out conducted.   Noted no overlying erythema, induration, or other signs of local infection.   Skin prepped in a sterile fashion.   Local anesthesia: Topical Ethyl chloride.   With sterile technique and under real time ultrasound guidance: 40 mg of Kenalog and 2 mL of lidocaine injected into hip joint. Fluid seen entering the joint capsule.   Completed without difficulty   Pain immediately resolved suggesting  accurate placement of the medication.   Advised to call if fevers/chills, erythema, induration, drainage, or persistent bleeding.   Images permanently stored and available for review in the ultrasound unit.  Impression: Technically successful ultrasound guided injection.    X-ray images right hip obtained today personally and independently interpreted. Moderate to severe right hip DJD.  Mild to moderate left hip DJD.  No acute fractures are present. Await formal radiology review.   EXAM: LUMBAR SPINE - COMPLETE 4+ VIEW   COMPARISON:  None.   FINDINGS: Approximately 15 degrees dextroscoliosis of the lumbar spine. No fracture line or displaced fracture fragment seen. No acute or suspicious osseous lesion. Upper sacrum appears intact and appropriately positioned. No evidence of pars interarticularis defect seen.   Disc desiccations at multiple levels, most prominent at the L2-3 and L5-S1 levels, with associated disc space narrowings and osseous spurring. Additional degenerative hypertrophy noted at the L4-5 and L5-S1 levels. Slight anterolisthesis of L4 on L5 is likely related to the underlying degenerative change.   Atherosclerosis of the infrarenal abdominal aorta. Visualized paravertebral soft tissues are otherwise unremarkable. At least mild degenerative change noted at the right hip joint.   IMPRESSION: 1. Degenerative changes and scoliosis of the lumbar spine, mild to moderate in degree, as detailed above. Findings could indicate associated nerve root impingement. If findings suggest radiculopathic etiology, would consider nonemergent lumbar spine MRI for further characterization. 2. No acute findings. 3. Aortic atherosclerosis.     Electronically Signed   By: [name] M.D.   On: 05/05/2017 14:00  I, Andrew Cole, personally (independently) visualized and performed the interpretation of the images attached in this note.    Assessment and Plan: 76 y.o.  male with right anterior hip pain due to DJD.  Tr[place]wn will require hip replacement before too long.  Plan for steroid injection today to diagnose source of pain due to DJD.  He had great response to injection today indicating that his hip joint is a source of pain.  Additionally the injection may buy him some time to do some planning before hip replacement.  However I do not think it is can to be very long before he needs replacement.  Will refer to orthopedic surgery for surgical discussion and planning.   PDMP not reviewed this encounter. Orders Placed This Encounter  Procedures   DG HIP UNILAT W OR W/O PELVIS 2-3 VIEWS RIGHT    Standing Status:   Future    Number of Occurrences:   1    Standing Expiration Date:   05/11/2022    Order Specific Question:   Reason for Exam (SYMPTOM  OR DIAGNOSIS REQUIRED)    Answer:   right hip pain    Order Specific Question:   Preferred imaging location?    Answer:   [facility]   [place] LIMITED JOINT SPACE STRUCTURES LOW RIGHT(NO LINKED CHARGES)    Order Specific Question:   Reason for Exam (SYMPTOM  OR DIAGNOSIS REQUIRED)    Answer:   hip injection    Order Specific Question:   Preferred imaging location?    Answer:   [place]   Ambulatory referral to Orthopedic Surgery    Referral Priority:   Routine    Referral Type:   Surgical    Referral Reason:   Specialty Services Required    Requested Specialty:   Orthopedic Surgery    Number of Visits Requested:   1   No orders of the defined types were placed in this encounter.    Discussed warning signs or symptoms. Please see discharge instructions. Patient expresses understanding.   The above documentation has been reviewed and is accurate and complete Andrew Cole, M.D.

## 2022-04-14 NOTE — Progress Notes (Signed)
Right hip x-ray shows medium arthritis.

## 2022-04-22 ENCOUNTER — Other Ambulatory Visit: Payer: Self-pay | Admitting: Internal Medicine

## 2022-05-09 DIAGNOSIS — M1611 Unilateral primary osteoarthritis, right hip: Secondary | ICD-10-CM | POA: Diagnosis not present

## 2022-06-16 ENCOUNTER — Other Ambulatory Visit: Payer: Self-pay | Admitting: Internal Medicine

## 2022-07-01 DIAGNOSIS — H35371 Puckering of macula, right eye: Secondary | ICD-10-CM | POA: Diagnosis not present

## 2022-07-01 DIAGNOSIS — H26493 Other secondary cataract, bilateral: Secondary | ICD-10-CM | POA: Diagnosis not present

## 2022-07-01 DIAGNOSIS — H35033 Hypertensive retinopathy, bilateral: Secondary | ICD-10-CM | POA: Diagnosis not present

## 2022-08-18 ENCOUNTER — Other Ambulatory Visit: Payer: Self-pay | Admitting: Internal Medicine

## 2022-08-21 DIAGNOSIS — L821 Other seborrheic keratosis: Secondary | ICD-10-CM | POA: Diagnosis not present

## 2022-08-21 DIAGNOSIS — L570 Actinic keratosis: Secondary | ICD-10-CM | POA: Diagnosis not present

## 2022-08-21 DIAGNOSIS — L720 Epidermal cyst: Secondary | ICD-10-CM | POA: Diagnosis not present

## 2022-09-11 ENCOUNTER — Other Ambulatory Visit: Payer: Self-pay | Admitting: Internal Medicine

## 2022-09-11 IMAGING — US US RENAL
1 series · 14 of 25 positions shown · non-contrast
Comparison: None.

CLINICAL DATA: Proteinuria.  Hypertension

EXAM:
RENAL / URINARY TRACT ULTRASOUND COMPLETE

[Series 1: us renal · 0.23mm/px · 14 of 41 slices shown]
[im 1/41]
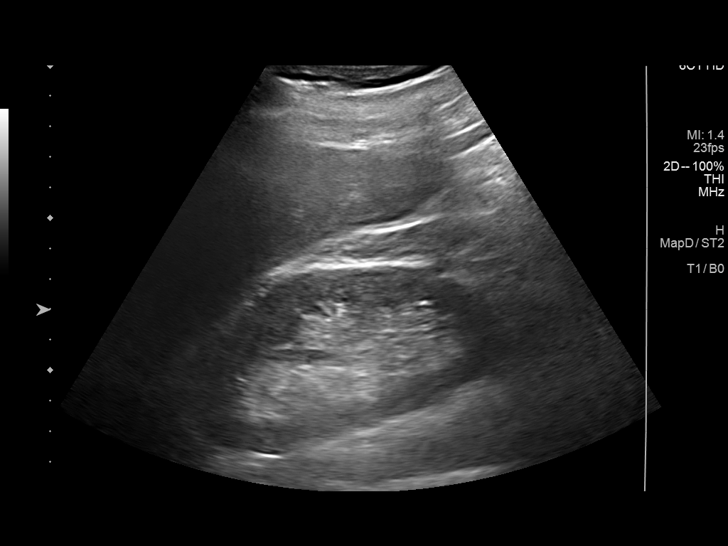
[im 4/41]
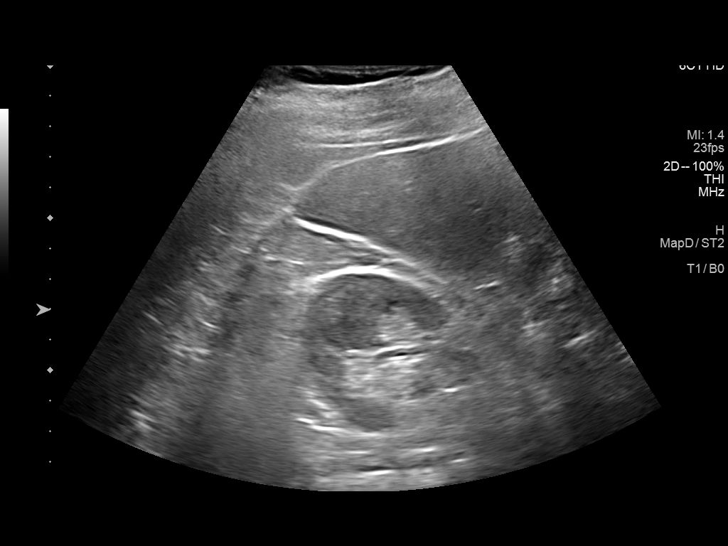
[im 7/41]
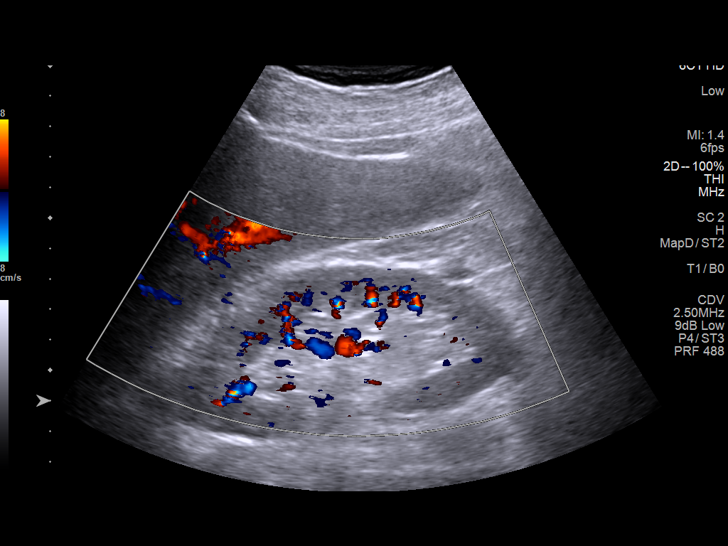
[im 11/41]
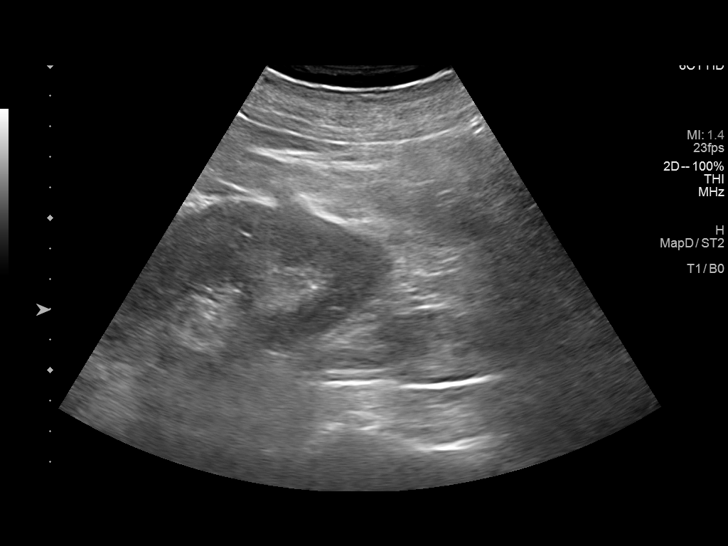
[im 14/41]
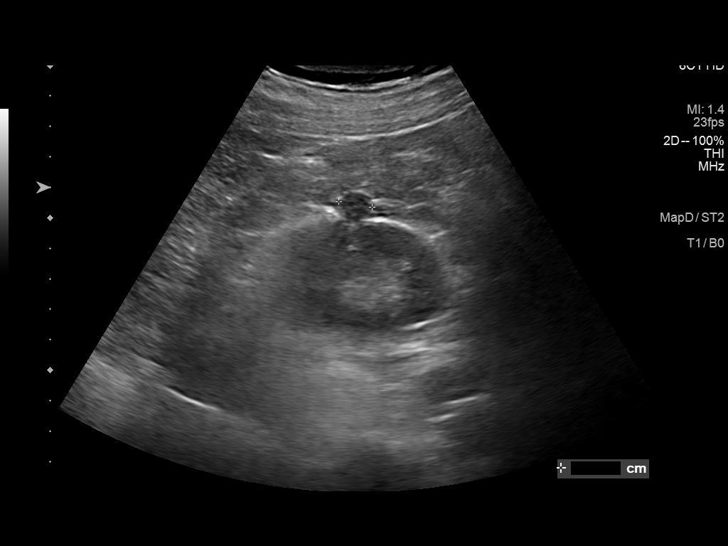
[im 16/41]
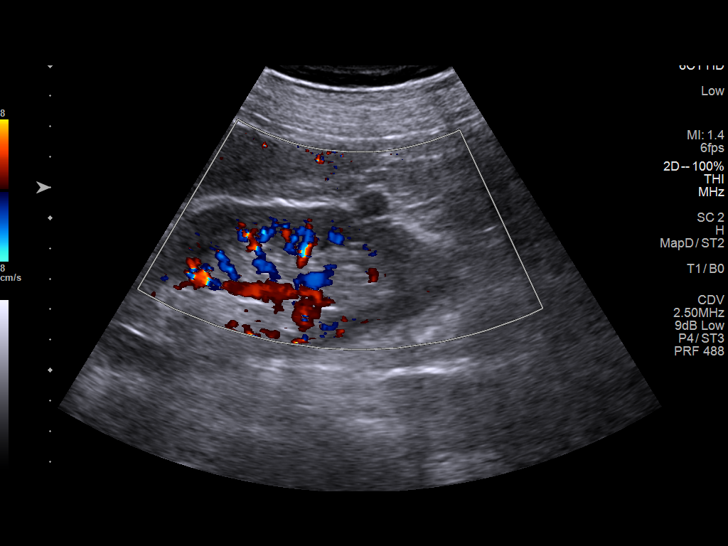
[im 19/41]
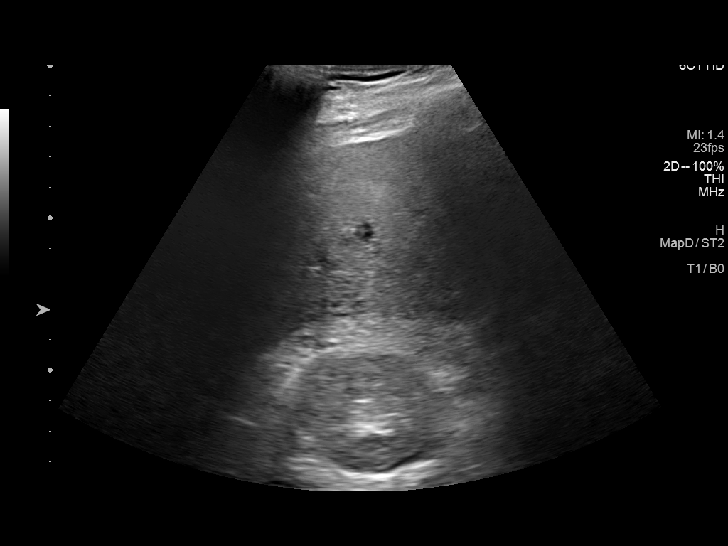
[im 22/41]
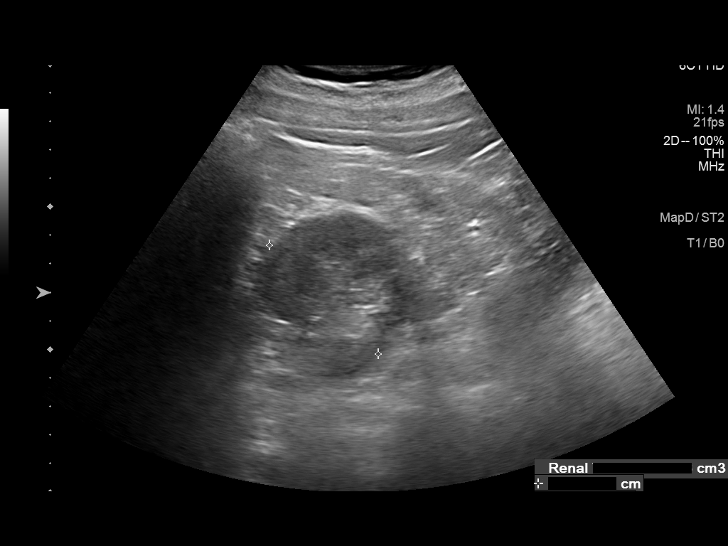
[im 26/41]
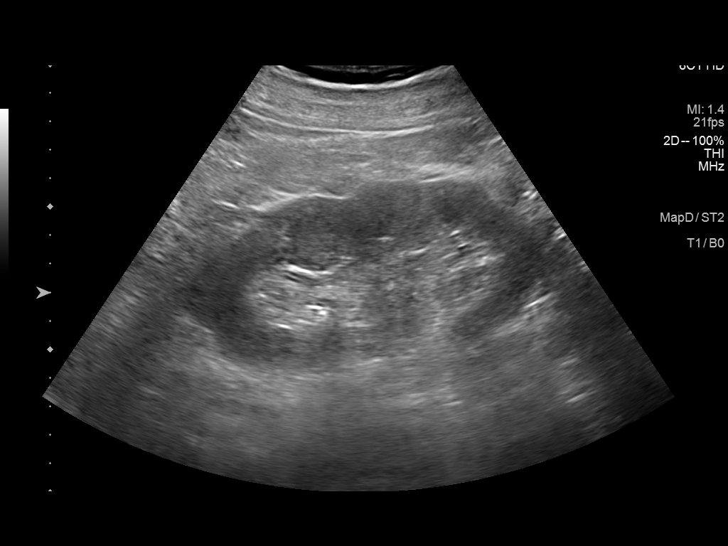
[im 27/41]
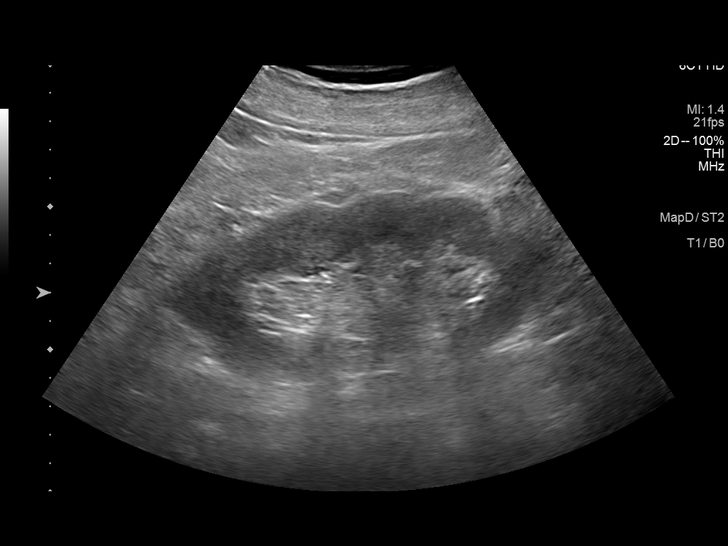
[im 31/41]
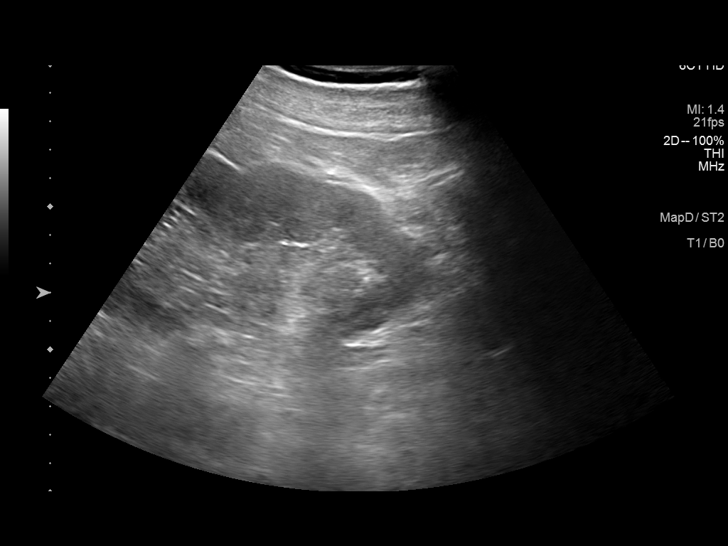
[im 34/41]
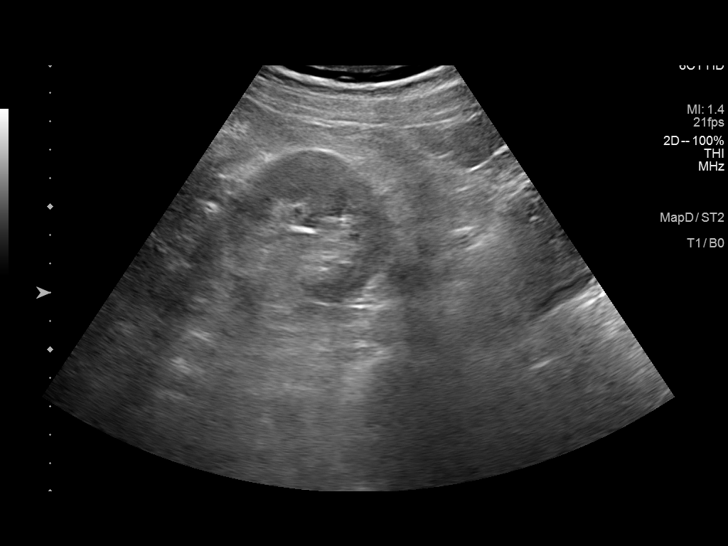
[im 37/41]
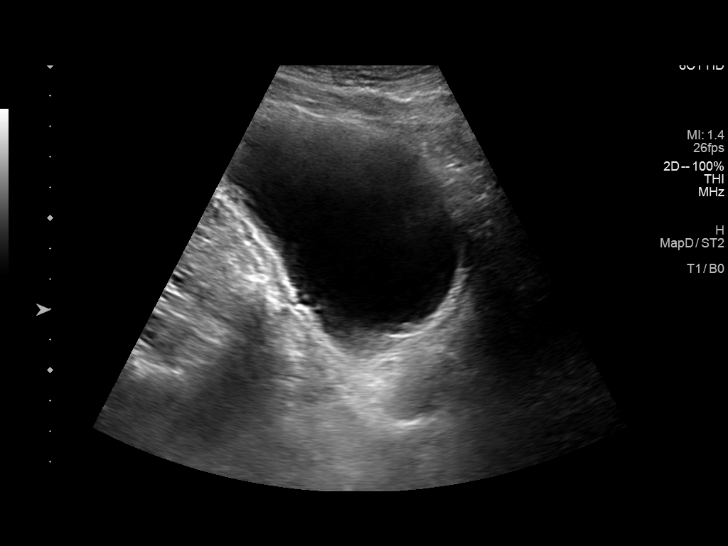
[im 41/41]
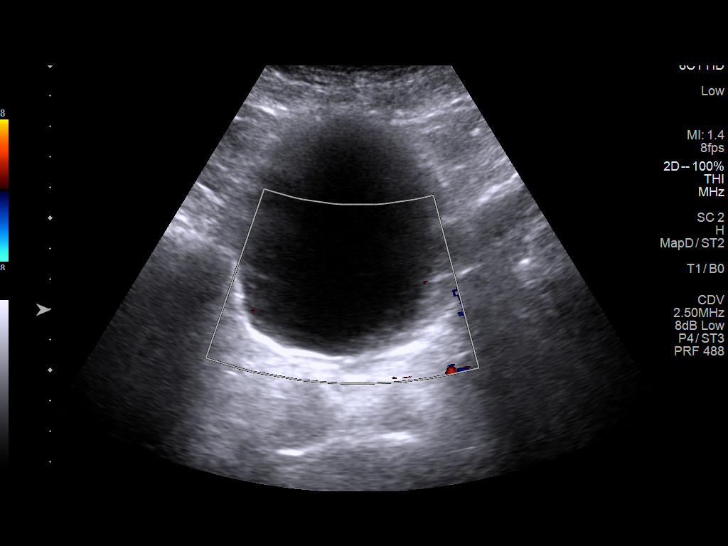

[14 of 25 positions shown; findings below may reference images not displayed]

FINDINGS: Right Kidney:

Renal measurements: 11.6 x 5.3 x 5.2 cm = volume: 168 mL. Rounded
hypoechoic lesion (1.1 cm) exophytic from the lower pole of the
RIGHT kidney without through transmission.

Left Kidney:

Renal measurements: 13.4 x 6.4 x 5.4 cm = volume: 239 mL.
Echogenicity within normal limits. No mass or hydronephrosis
visualized.

Bladder:

Appears normal for degree of bladder distention.

Other:

None.
IMPRESSION: 1. No hydronephrosis.
2. Indeterminate round lesion exophytic from the lower pole of the
RIGHT kidney. Recommend contrast CT or MRI renal protocol further
characterization. MRI preferred.

## 2022-10-07 ENCOUNTER — Other Ambulatory Visit: Payer: Medicare PPO

## 2022-10-07 DIAGNOSIS — I10 Essential (primary) hypertension: Secondary | ICD-10-CM

## 2022-10-07 DIAGNOSIS — R972 Elevated prostate specific antigen [PSA]: Secondary | ICD-10-CM | POA: Diagnosis not present

## 2022-10-07 DIAGNOSIS — E7800 Pure hypercholesterolemia, unspecified: Secondary | ICD-10-CM | POA: Diagnosis not present

## 2022-10-07 DIAGNOSIS — N401 Enlarged prostate with lower urinary tract symptoms: Secondary | ICD-10-CM

## 2022-10-08 LAB — COMPLETE METABOLIC PANEL WITH GFR
AG Ratio: 2.4 (calc) (ref 1.0–2.5)
ALT: 26 U/L (ref 9–46)
AST: 18 U/L (ref 10–35)
Albumin: 4.5 g/dL (ref 3.6–5.1)
Alkaline phosphatase (APISO): 44 U/L (ref 35–144)
BUN: 23 mg/dL (ref 7–25)
CO2: 26 mmol/L (ref 20–32)
Calcium: 9.8 mg/dL (ref 8.6–10.3)
Chloride: 105 mmol/L (ref 98–110)
Creat: 0.98 mg/dL (ref 0.70–1.28)
Globulin: 1.9 g/dL (calc) (ref 1.9–3.7)
Glucose, Bld: 85 mg/dL (ref 65–99)
Potassium: 4.2 mmol/L (ref 3.5–5.3)
Sodium: 141 mmol/L (ref 135–146)
Total Bilirubin: 0.5 mg/dL (ref 0.2–1.2)
Total Protein: 6.4 g/dL (ref 6.1–8.1)
eGFR: 80 mL/min/{1.73_m2} (ref >=60)

## 2022-10-08 LAB — CBC WITH DIFFERENTIAL/PLATELET
Absolute Monocytes: 759 cells/uL (ref 200–950)
Basophils Absolute: 28 cells/uL (ref 0–200)
Basophils Relative: 0.5 %
Eosinophils Absolute: 187 cells/uL (ref 15–500)
Eosinophils Relative: 3.4 %
HCT: 42.6 % (ref 38.5–50.0)
Hemoglobin: 14.8 g/dL (ref 13.2–17.1)
Lymphs Abs: 2266 cells/uL (ref 850–3900)
MCH: 33.3 pg — ABNORMAL HIGH (ref 27.0–33.0)
MCHC: 34.7 g/dL (ref 32.0–36.0)
MCV: 95.7 fL (ref 80.0–100.0)
MPV: 9.5 fL (ref 7.5–12.5)
Monocytes Relative: 13.8 %
Neutro Abs: 2261 cells/uL (ref 1500–7800)
Neutrophils Relative %: 41.1 %
Platelets: 200 10*3/uL (ref 140–400)
RBC: 4.45 10*6/uL (ref 4.20–5.80)
RDW: 12.9 % (ref 11.0–15.0)
Total Lymphocyte: 41.2 %
WBC: 5.5 10*3/uL (ref 3.8–10.8)

## 2022-10-08 LAB — LIPID PANEL
Cholesterol: 192 mg/dL (ref <200)
HDL: 105 mg/dL (ref >=40)
LDL Cholesterol (Calc): 73 mg/dL (calc)
Non-HDL Cholesterol (Calc): 87 mg/dL (calc) (ref <130)
Total CHOL/HDL Ratio: 1.8 (calc) (ref <5.0)
Triglycerides: 65 mg/dL (ref <150)

## 2022-10-08 LAB — PSA: PSA: 2.12 ng/mL (ref <=4.00)

## 2022-10-09 ENCOUNTER — Encounter: Payer: Self-pay | Admitting: Internal Medicine

## 2022-10-09 ENCOUNTER — Ambulatory Visit (INDEPENDENT_AMBULATORY_CARE_PROVIDER_SITE_OTHER): Payer: Medicare PPO | Admitting: Internal Medicine

## 2022-10-09 VITALS — BP 124/68 | HR 84 | Temp 98.6°F | Ht 66.0 in | Wt 184.4 lb

## 2022-10-09 DIAGNOSIS — R81 Glycosuria: Secondary | ICD-10-CM | POA: Diagnosis not present

## 2022-10-09 DIAGNOSIS — N401 Enlarged prostate with lower urinary tract symptoms: Secondary | ICD-10-CM | POA: Diagnosis not present

## 2022-10-09 DIAGNOSIS — K429 Umbilical hernia without obstruction or gangrene: Secondary | ICD-10-CM | POA: Diagnosis not present

## 2022-10-09 DIAGNOSIS — R809 Proteinuria, unspecified: Secondary | ICD-10-CM | POA: Diagnosis not present

## 2022-10-09 DIAGNOSIS — E7800 Pure hypercholesterolemia, unspecified: Secondary | ICD-10-CM

## 2022-10-09 DIAGNOSIS — I10 Essential (primary) hypertension: Secondary | ICD-10-CM | POA: Diagnosis not present

## 2022-10-09 DIAGNOSIS — Z23 Encounter for immunization: Secondary | ICD-10-CM

## 2022-10-09 DIAGNOSIS — Z0000 Encounter for general adult medical examination without abnormal findings: Secondary | ICD-10-CM | POA: Diagnosis not present

## 2022-10-09 DIAGNOSIS — Z86010 Personal history of colonic polyps: Secondary | ICD-10-CM

## 2022-10-09 DIAGNOSIS — Z87898 Personal history of other specified conditions: Secondary | ICD-10-CM

## 2022-10-09 LAB — UNLABELED: Test Ordered On Req: 6517

## 2022-10-09 LAB — POCT URINALYSIS DIPSTICK
Bilirubin, UA: NEGATIVE
Blood, UA: NEGATIVE
Glucose, UA: POSITIVE — AB
Ketones, UA: POSITIVE
Leukocytes, UA: NEGATIVE
Nitrite, UA: NEGATIVE
Protein, UA: POSITIVE — AB
Spec Grav, UA: 1.01 (ref 1.010–1.025)
Urobilinogen, UA: 0.2 E.U./dL
pH, UA: 7 (ref 5.0–8.0)

## 2022-10-09 NOTE — Progress Notes (Signed)
Annual Wellness Visit     Patient: Andrew Cole, Male    DOB: Jul 01, 1946, 76 y.o.   MRN: [id number] Visit Date: 10/09/2022   Subjective    Andrew Cole is a 76 y.o. male who presents today for his Annual Wellness Visit.  HPI Also here for health maintenance exam and evaluation of medical issues. Now on Farxiga per Dr. Posey Pronto. Hx of mild chronic kidney disease.  He saw Dr. Diona Fanti in February 2023 who examined his proatat and felt lobes were symmetrical without concern and to return as needed.  Had colonoscopy early 2023 with total of 6 polyps removed. They will recall when procedure is due.  Periumbilical hernia present. His nephew who is a Psychologist, sport and exercise looked at it and advised repair.  Vaccines discussed.had some at [facility]. We do not have those updated but will call them.  Having some arthritis issues in his hands. Had some injections with Dr. Alonza Bogus Dr. [name] for right hip pain who gave him an injection.  Umbilical hernia has been present for over a year at least.  He has mild chronic kidney disease and is followed by [facility].  Last seen in January 2023.  Dr. Posey Pronto is his nephrologist.  Has been told to avoid NSAIDs.  He takes amlodipine for mild hypertension.  Has been tried on Farxiga to help with proteinuria.  History of hypertension treated with amlodipine and losartan/HCTZ.  Has been told to watch his weight and keep his BP under control by Nephrology.  Had colonoscopy in March 2023.  History of adenomatous colon polyps.  Had colonoscopy with Dr. Earlean Shawl in 2016 with single polyp noted.  In 2019 he had multiple polyps removed by Dr. Earlean Shawl.  Zocor causes myalgias but Crestor as tolerated.  History of anxiety treated with Xanax as needed.  History of allergic rhinitis.  Smoked in the remote past.  Social history: He is a retired Pharmacist, hospital.  He is a widower.  His wife died January 8315 of complications from a sudden subarachnoid hemorrhage  and had no prior symptoms before the event.  He has 2 adult sons.  He enjoys working outside and stays in good physical condition.  Family history: Mother died with metastatic ovarian cancer.  Father with history of colon polyps and hyperlipidemia.  1 brother and 2 sisters.   Patient had COVID in January 2022.  He had mild symptoms.  History of mild proteinuria evaluated also by nephrology.  He had 24-hour urine protein performed here in March 2022 and had total of 221 mg/dL.  Urine protein electrophoresis was unremarkable.  At that time he had 3+ protein on urine dipstick.  History of snoring but has not had sleep study to my knowledge.   Patient Care Team: Elby Showers, MD as PCP - General (Internal Medicine)  Review of Systems no new complaints-he saw urologist who felt that his prostate was within normal limits.   Objective    Vitals: BP 124/68   Pulse 84   Temp 98.6 F (37 C) (Tympanic)   Ht '5\' 6"'$  (1.676 m)   Wt 184 lb 6.4 oz (83.6 kg)   SpO2 98%   BMI 29.76 kg/m   Physical Exam Skin: warm and dry.  Nodes none.  TMs clear.  Pharynx is clear.  Neck is supple.  Chest clear.  Abdomen soft nondistended without hepatosplenomegaly masses or tenderness.  There is a small umbilical hernia.  It is reducible.  Prostate  exam deferred.  No lower extremity pitting edema.  Neuro is intact without gross focal deficits.  Prostate deferred at pt. request. Most recent functional status assessment:    10/09/2022    2:12 PM  In your present state of health, do you have any difficulty performing the following activities:  Hearing? 0  Vision? 0  Difficulty concentrating or making decisions? 0  Walking or climbing stairs? 0  Dressing or bathing? 0  Doing errands, shopping? 0  Preparing Food and eating ? N  Using the Toilet? N  In the past six months, have you accidently leaked urine? N  Do you have problems with loss of bowel control? N  Managing your Medications? N  Managing your  Finances? N  Housekeeping or managing your Housekeeping? N   Most recent fall risk assessment:    10/09/2022    2:09 PM  [place] in the past year? 0  Number falls in past yr: 0  Injury with Fall? 0  Risk for fall due to : No Fall Risks  Follow up Falls prevention discussed    Most recent depression screenings:    10/09/2022    2:10 PM 10/02/2021   12:15 PM  PHQ 2/9 Scores  PHQ - 2 Score 2 0  PHQ- 9 Score 2    Most recent cognitive screening:    10/09/2022    2:13 PM  6CIT Screen  What Year? 0 points  What month? 0 points  What time? 0 points  Count back from 20 0 points  Months in reverse 0 points  Repeat phrase 0 points  Total Score 0 points       Assessment & Plan    Proteinuria- followed by [facility] and stable  Essential hypertension-longstanding under good control  History of adenomatous colon polyps  History of allergic rhinitis  History of hyperlipidemia treated with statin and stable  History of mild anxiety  Bereavement-misses his wife  Small periumbilical hernia-patient would like to see a surgeon   Plan: Continue follow-up with [facility].  He did see Dr. Diona Fanti this past year for check on his prostate and it was felt to be within normal limits.  Continue follow-up with Dr. Posey Pronto with [facility] regarding proteinuria.  He is to avoid NSAIDs.  Continue current medications.  Return in 1 year or as needed.  Referral to general surgery regarding small periumbilical hernia       Annual wellness visit done today including the all of the following: Reviewed patient's Family Medical History Reviewed and updated list of patient's medical providers Assessment of cognitive impairment was done Assessed patient's functional ability Established a written schedule for health screening [place] Completed and Reviewed  Discussed health benefits of physical activity, and  encouraged him to engage in regular exercise appropriate for his age and condition.     IElby Showers, MD, have reviewed all documentation for this visit. The documentation on 10/25/22 for the exam, diagnosis, procedures, and orders are all accurate and complete.    {I, Elby Showers, MD, have reviewed all documentation for this visit. The documentation on 10/09/22 for the exam, diagnosis, procedures, and orders are all accurate and complete.   LaVon Barron Alvine, CMA

## 2022-10-13 ENCOUNTER — Telehealth: Payer: Self-pay

## 2022-10-13 NOTE — Telephone Encounter (Signed)
Spoke with Colletta Maryland from [facility] to confirm fax was received and to check on status of Microalbumin results was told results are pending and results should be released by 10/15/22

## 2022-10-14 LAB — PAT ID TIQ DOC: Test Affected: 6517

## 2022-10-14 LAB — MICROALBUMIN / CREATININE URINE RATIO
Creatinine, Urine: 87 mg/dL (ref 20–320)
Microalb Creat Ratio: 1021 mcg/mg creat — ABNORMAL HIGH (ref <30)
Microalb, Ur: 88.8 mg/dL

## 2022-10-23 ENCOUNTER — Telehealth: Payer: Self-pay | Admitting: Internal Medicine

## 2022-10-23 NOTE — Telephone Encounter (Signed)
Referral placed.

## 2022-10-23 NOTE — Telephone Encounter (Signed)
Andrew Cole called and said he is ready to go ahead with referral for the surgeon for the hernia.

## 2022-10-25 DIAGNOSIS — R809 Proteinuria, unspecified: Secondary | ICD-10-CM | POA: Insufficient documentation

## 2022-10-25 NOTE — Patient Instructions (Addendum)
He will be referred to General surgery for consultation regarding small periumbilical hernia.  Continue current medications and follow-up in 1 year or as needed.  Continue follow-up with Dr. Posey Pronto for proteinuria.

## 2022-10-31 DIAGNOSIS — K429 Umbilical hernia without obstruction or gangrene: Secondary | ICD-10-CM | POA: Diagnosis not present

## 2022-11-05 DIAGNOSIS — N182 Chronic kidney disease, stage 2 (mild): Secondary | ICD-10-CM | POA: Diagnosis not present

## 2022-11-05 DIAGNOSIS — E559 Vitamin D deficiency, unspecified: Secondary | ICD-10-CM | POA: Diagnosis not present

## 2022-11-13 DIAGNOSIS — N182 Chronic kidney disease, stage 2 (mild): Secondary | ICD-10-CM | POA: Diagnosis not present

## 2022-11-13 DIAGNOSIS — I129 Hypertensive chronic kidney disease with stage 1 through stage 4 chronic kidney disease, or unspecified chronic kidney disease: Secondary | ICD-10-CM | POA: Diagnosis not present

## 2022-11-13 DIAGNOSIS — R809 Proteinuria, unspecified: Secondary | ICD-10-CM | POA: Diagnosis not present

## 2022-11-24 DIAGNOSIS — M25561 Pain in right knee: Secondary | ICD-10-CM | POA: Diagnosis not present

## 2022-12-22 ENCOUNTER — Telehealth: Payer: Self-pay

## 2022-12-22 NOTE — Telephone Encounter (Signed)
Patient called asking if referral has been placed for hernia,  I informed patient referral has been placed [facility] attempted to call him to schedule appt but was unsuccessful.  Patient was advised to contact their office to schedule

## 2022-12-28 ENCOUNTER — Other Ambulatory Visit: Payer: Self-pay | Admitting: Internal Medicine

## 2023-02-03 ENCOUNTER — Other Ambulatory Visit: Payer: Self-pay | Admitting: Internal Medicine

## 2023-05-28 ENCOUNTER — Other Ambulatory Visit: Payer: Self-pay | Admitting: Internal Medicine

## 2023-08-03 DIAGNOSIS — H5203 Hypermetropia, bilateral: Secondary | ICD-10-CM | POA: Diagnosis not present

## 2023-08-03 DIAGNOSIS — H35371 Puckering of macula, right eye: Secondary | ICD-10-CM | POA: Diagnosis not present

## 2023-08-03 DIAGNOSIS — H35033 Hypertensive retinopathy, bilateral: Secondary | ICD-10-CM | POA: Diagnosis not present

## 2023-08-03 DIAGNOSIS — H524 Presbyopia: Secondary | ICD-10-CM | POA: Diagnosis not present

## 2023-08-03 DIAGNOSIS — H52223 Regular astigmatism, bilateral: Secondary | ICD-10-CM | POA: Diagnosis not present

## 2023-10-12 ENCOUNTER — Other Ambulatory Visit: Payer: Medicare PPO

## 2023-10-12 DIAGNOSIS — R809 Proteinuria, unspecified: Secondary | ICD-10-CM | POA: Diagnosis not present

## 2023-10-12 DIAGNOSIS — Z125 Encounter for screening for malignant neoplasm of prostate: Secondary | ICD-10-CM

## 2023-10-12 DIAGNOSIS — E7800 Pure hypercholesterolemia, unspecified: Secondary | ICD-10-CM

## 2023-10-12 DIAGNOSIS — Z0000 Encounter for general adult medical examination without abnormal findings: Secondary | ICD-10-CM

## 2023-10-12 DIAGNOSIS — I10 Essential (primary) hypertension: Secondary | ICD-10-CM | POA: Diagnosis not present

## 2023-10-13 LAB — MICROALBUMIN / CREATININE URINE RATIO
Creatinine, Urine: 61 mg/dL (ref 20–320)
Microalb Creat Ratio: 1305 mg/g{creat} — ABNORMAL HIGH (ref <30)
Microalb, Ur: 79.6 mg/dL

## 2023-10-13 LAB — CBC WITH DIFFERENTIAL/PLATELET
Absolute Lymphocytes: 1897 {cells}/uL (ref 850–3900)
Absolute Monocytes: 763 {cells}/uL (ref 200–950)
Basophils Absolute: 32 {cells}/uL (ref 0–200)
Basophils Relative: 0.6 %
Eosinophils Absolute: 170 {cells}/uL (ref 15–500)
Eosinophils Relative: 3.2 %
HCT: 42.6 % (ref 38.5–50.0)
Hemoglobin: 14.5 g/dL (ref 13.2–17.1)
MCH: 33.5 pg — ABNORMAL HIGH (ref 27.0–33.0)
MCHC: 34 g/dL (ref 32.0–36.0)
MCV: 98.4 fL (ref 80.0–100.0)
MPV: 9 fL (ref 7.5–12.5)
Monocytes Relative: 14.4 %
Neutro Abs: 2438 {cells}/uL (ref 1500–7800)
Neutrophils Relative %: 46 %
Platelets: 254 10*3/uL (ref 140–400)
RBC: 4.33 10*6/uL (ref 4.20–5.80)
RDW: 12.7 % (ref 11.0–15.0)
Total Lymphocyte: 35.8 %
WBC: 5.3 10*3/uL (ref 3.8–10.8)

## 2023-10-13 LAB — COMPLETE METABOLIC PANEL WITH GFR
AG Ratio: 1.9 (calc) (ref 1.0–2.5)
ALT: 22 U/L (ref 9–46)
AST: 16 U/L (ref 10–35)
Albumin: 4.3 g/dL (ref 3.6–5.1)
Alkaline phosphatase (APISO): 49 U/L (ref 35–144)
BUN: 18 mg/dL (ref 7–25)
CO2: 27 mmol/L (ref 20–32)
Calcium: 9.6 mg/dL (ref 8.6–10.3)
Chloride: 100 mmol/L (ref 98–110)
Creat: 0.79 mg/dL (ref 0.70–1.28)
Globulin: 2.3 g/dL (ref 1.9–3.7)
Glucose, Bld: 83 mg/dL (ref 65–99)
Potassium: 4.2 mmol/L (ref 3.5–5.3)
Sodium: 137 mmol/L (ref 135–146)
Total Bilirubin: 0.4 mg/dL (ref 0.2–1.2)
Total Protein: 6.6 g/dL (ref 6.1–8.1)
eGFR: 92 mL/min/{1.73_m2} (ref >=60)

## 2023-10-13 LAB — LIPID PANEL
Cholesterol: 191 mg/dL (ref <200)
HDL: 99 mg/dL (ref >=40)
LDL Cholesterol (Calc): 78 mg/dL
Non-HDL Cholesterol (Calc): 92 mg/dL (ref <130)
Total CHOL/HDL Ratio: 1.9 (calc) (ref <5.0)
Triglycerides: 62 mg/dL (ref <150)

## 2023-10-13 LAB — PSA: PSA: 2.42 ng/mL (ref <=4.00)

## 2023-10-13 NOTE — Progress Notes (Signed)
Annual Wellness Visit    Patient Care Team: Margaree Mackintosh, MD as PCP - General (Internal Medicine)  Visit Date: 10/15/23   Chief Complaint  Patient presents with   Medicare Wellness   Subjective:   Patient: Andrew Cole, Male    DOB: August 19, 1946, 77 y.o.   MRN: [id number]  Andrew Cole is a 77 y.o. Male who presents today for his Annual Wellness Visit.  History of Hypertension treated with 5 mg Amlodipine and 100-25 mg Losartan-Hydrochlorothiazide. He reports occasional LE swelling, mainly during hot weather. Notes he has cut back on salt in his diet.   History of Hyperlipidemia treated with. 10/12/23 lipid panel WNL.   Elevated microalbumin of 1305. He would like this rechecked as he had had some drinks the night before his previous labs. He brought his own sample.   History of Allergy/Asthma treated with 108 mcg Albuterol inhaler PRN and 50 mcg Flonase daily.   History of chronic right hip pain has started to re-occur 1.5 years after receiving intra-articular injection.   The pain of his right knee joint has not bothered him at all since January.   Mentions that he recently had a issue with trigger finger and couldn't get in to see Dr. Mina Marble, but notes that using [facility] gummies resolves his trigger finger.   Vaccine: Covid and Flu UTD 08/05/23  Colonoscopy 01/07/22 - hx of adenomatous polyps - 6 polyps removed (3 semi-pedunculated 6-8 mm in ascending colon, 3 1 cm x2 sessile and x1 12 mm). Repeat 3 years - 2027.   PSA 2.42  10/12/23 CBC abnormal results: 33.5 [facility]  Past Medical History:  Diagnosis Date   Adenomatous polyps    Allergy    Asthma    Hyperlipidemia    Hypertension    Impotence      Family History  Problem Relation Age of Onset   Cancer Mother    Cancer Father      Social history: He is a widower.  He is a retired Runner, broadcasting/film/video.  Enjoys physical labor such as fence building.  He is in excellent physical condition.  Non-smoker.  2 sons.    Review of  Systems  Constitutional:  Negative for chills, fever, malaise/fatigue and weight loss.  HENT:  Negative for hearing loss, sinus pain and sore throat.   Respiratory:  Negative for cough, hemoptysis and shortness of breath.   Cardiovascular:  Negative for chest pain, palpitations, leg swelling and PND.  Gastrointestinal:  Negative for abdominal pain, constipation, diarrhea, heartburn, nausea and vomiting.  Genitourinary:  Negative for dysuria, frequency and urgency.  Musculoskeletal:  Negative for back pain, myalgias and neck pain.  Skin:  Negative for itching and rash.  Neurological:  Negative for dizziness, tingling, seizures and headaches.  Endo/Heme/Allergies:  Negative for polydipsia.  Psychiatric/Behavioral:  Negative for depression. The patient is not nervous/anxious.       Objective:   Vitals: BP 110/80   Pulse 77   Ht 5\' 6"  (1.676 m)   Wt 182 lb (82.6 kg)   SpO2 96%   BMI 29.38 kg/m   Physical Exam Vitals and nursing note reviewed.  Constitutional:      General: He is awake. He is not in acute distress.    Appearance: Normal appearance. He is not ill-appearing or toxic-appearing.  HENT:     Head: Normocephalic and atraumatic.     Right Ear: Tympanic membrane, ear canal and external ear normal.     Left  Ear: Tympanic membrane, ear canal and external ear normal.     Mouth/Throat:     Pharynx: Oropharynx is clear.  Eyes:     Extraocular Movements: Extraocular movements intact.     Pupils: Pupils are equal, round, and reactive to light.  Neck:     Thyroid: No thyroid mass, thyromegaly or thyroid tenderness.     Vascular: No carotid bruit.  Cardiovascular:     Rate and Rhythm: Normal rate and regular rhythm. No extrasystoles are present.    Pulses:          Dorsalis pedis pulses are 1+ on the right side and 1+ on the left side.     Heart sounds: Normal heart sounds. No murmur heard.    No friction rub. No gallop.  Pulmonary:     Effort: Pulmonary effort is normal.      Breath sounds: Normal breath sounds. No decreased breath sounds, wheezing, rhonchi or rales.  Chest:     Chest wall: No mass.  Abdominal:     Palpations: Abdomen is soft. There is no hepatomegaly, splenomegaly or mass.     Tenderness: There is no abdominal tenderness.     Hernia: A hernia is present. Hernia is present in the ventral area.  Genitourinary:    Comments: Prostrate exam deferred Musculoskeletal:     Cervical back: Normal range of motion.     Right lower leg: No edema.     Left lower leg: No edema.  Lymphadenopathy:     Cervical: No cervical adenopathy.     Upper Body:     Right upper body: No supraclavicular adenopathy.     Left upper body: No supraclavicular adenopathy.  Skin:    General: Skin is warm and dry.  Neurological:     General: No focal deficit present.     Mental Status: He is alert and oriented to person, place, and time. Mental status is at baseline.     Cranial Nerves: Cranial nerves 2-12 are intact.     Sensory: Sensation is intact.     Motor: Motor function is intact.     Coordination: Coordination is intact.     Gait: Gait is intact.     Deep Tendon Reflexes: Reflexes are normal and symmetric.  Psychiatric:        Attention and Perception: Attention normal.        Mood and Affect: Mood normal.        Speech: Speech normal.        Behavior: Behavior normal. Behavior is cooperative.        Thought Content: Thought content normal.        Cognition and Memory: Cognition and memory normal.        Judgment: Judgment normal.    Most recent functional status assessment:     No data to display         Most recent fall risk assessment:    10/15/2023    3:08 PM  Fall Risk   Falls in the past year? 0  Number falls in past yr: 0  Injury with Fall? 0  Risk for fall due to : No Fall Risks  Follow up Education provided;Falls evaluation completed;Falls prevention discussed    Most recent depression screenings:    10/15/2023    3:10 PM  10/09/2022    2:10 PM  PHQ 2/9 Scores  PHQ - 2 Score 0 2  PHQ- 9 Score  2   Most recent cognitive screening:  10/15/2023    3:13 PM  6CIT Screen  What Year? 0 points  What month? 0 points  What time? 0 points  Count back from 20 0 points  Months in reverse 0 points  Repeat phrase 0 points  Total Score 0 points     Results:   Studies obtained and personally reviewed by me:  Colonoscopy 01/07/22 - hx of adenomatous polyps - 6 polyps removed (3 semi-pedunculated 6-8 mm in ascending colon, 3 1 cm x2 sessile and x1 12 mm). Repeat 3 years - 2027.   Labs:       Component Value Date/Time   NA 137 10/12/2023 1031   K 4.2 10/12/2023 1031   CL 100 10/12/2023 1031   CO2 27 10/12/2023 1031   GLUCOSE 83 10/12/2023 1031   BUN 18 10/12/2023 1031   CREATININE 0.79 10/12/2023 1031   CALCIUM 9.6 10/12/2023 1031   PROT 6.6 10/12/2023 1031   ALBUMIN 4.5 05/07/2017 0958   AST 16 10/12/2023 1031   ALT 22 10/12/2023 1031   ALKPHOS 54 05/07/2017 0958   BILITOT 0.4 10/12/2023 1031   GFRNONAA 80 09/24/2020 1213   GFRAA 93 09/24/2020 1213     Lab Results  Component Value Date   WBC 5.3 10/12/2023   HGB 14.5 10/12/2023   HCT 42.6 10/12/2023   MCV 98.4 10/12/2023   PLT 254 10/12/2023    Lab Results  Component Value Date   CHOL 191 10/12/2023   HDL 99 10/12/2023   LDLCALC 78 10/12/2023   TRIG 62 10/12/2023   CHOLHDL 1.9 10/12/2023    Lab Results  Component Value Date   HGBA1C 5.4 11/30/2020     No results found for: "TSH"   Lab Results  Component Value Date   PSA 2.42 10/12/2023   PSA 2.12 10/07/2022   PSA 2.01 09/24/2021    Assessment & Plan:   Hypertension: treated with 5 mg Amlodipine and 100-25 mg Losartan-Hydrochlorothiazide. He reports occasional LE swelling, mainly during hot weather. Notes he has cut back on salt in his diet.   Hyperlipidemia: treated with. 10/12/23 lipid panel WNL.   Elevated microalbumin: of 1305. He would like this rechecked as he  had had some drinks the night before his previous labs. He brought his own sample.   Allergy/Asthma: treated with  Albuterol inhaler PRN and  Flonase daily.  Orson Eva, MD, have reviewed all documentation for this visit. The documentation on 10/25/23 for the exam, diagnosis, procedures, and orders are all accurate and complete.  Vaccine Counseling: Covid and Flu UTD 08/05/23  Colonoscopy: 01/07/22 - hx of adenomatous polyps - 6 polyps removed (3 semi-pedunculated 6-8 mm in ascending colon, 3 1 cm x2 sessile and x1 12 mm). Repeat 3 years - 2027.   Urinalysis ordered.    Annual wellness visit done today including the all of the following: Reviewed patient's Family Medical History Reviewed and updated list of patient's medical providers Assessment of cognitive impairment was done Assessed patient's functional ability Established a written schedule for health screening services Health Risk Assessent Completed and Reviewed  Discussed health benefits of physical activity, and encouraged him to engage in regular exercise appropriate for his age and condition.        I,Emily Lagle,acting as a Neurosurgeon for Margaree Mackintosh, MD.,have documented all relevant documentation on the behalf of Margaree Mackintosh, MD,as directed by  Margaree Mackintosh, MD while in the presence of Margaree Mackintosh, MD.   Brien Mates  Kyung Rudd, MD, have reviewed all documentation for this visit. The documentation on 10/25/23 for the exam, diagnosis, procedures, and orders are all accurate and complete.

## 2023-10-15 ENCOUNTER — Ambulatory Visit: Payer: Medicare PPO | Admitting: Internal Medicine

## 2023-10-15 ENCOUNTER — Encounter: Payer: Self-pay | Admitting: Internal Medicine

## 2023-10-15 VITALS — BP 110/80 | HR 77 | Ht 66.0 in | Wt 182.0 lb

## 2023-10-15 DIAGNOSIS — R809 Proteinuria, unspecified: Secondary | ICD-10-CM | POA: Diagnosis not present

## 2023-10-15 DIAGNOSIS — N182 Chronic kidney disease, stage 2 (mild): Secondary | ICD-10-CM | POA: Diagnosis not present

## 2023-10-15 DIAGNOSIS — Z860101 Personal history of adenomatous and serrated colon polyps: Secondary | ICD-10-CM | POA: Diagnosis not present

## 2023-10-15 DIAGNOSIS — J301 Allergic rhinitis due to pollen: Secondary | ICD-10-CM | POA: Diagnosis not present

## 2023-10-15 DIAGNOSIS — Z0000 Encounter for general adult medical examination without abnormal findings: Secondary | ICD-10-CM | POA: Diagnosis not present

## 2023-10-15 DIAGNOSIS — I10 Essential (primary) hypertension: Secondary | ICD-10-CM

## 2023-10-15 DIAGNOSIS — E7800 Pure hypercholesterolemia, unspecified: Secondary | ICD-10-CM | POA: Diagnosis not present

## 2023-10-15 LAB — POCT URINALYSIS DIP (CLINITEK)
Bilirubin, UA: NEGATIVE
Blood, UA: NEGATIVE
Glucose, UA: 250 mg/dL — AB
Ketones, POC UA: NEGATIVE mg/dL
Leukocytes, UA: NEGATIVE
Nitrite, UA: NEGATIVE
POC PROTEIN,UA: >=300 — AB
Spec Grav, UA: 1.01 (ref 1.010–1.025)
Urobilinogen, UA: 0.2 U/dL
pH, UA: 7.5 (ref 5.0–8.0)

## 2023-10-25 ENCOUNTER — Encounter: Payer: Self-pay | Admitting: Internal Medicine

## 2023-10-25 NOTE — Patient Instructions (Signed)
It was a pleasure to see you today as always.  Please continue follow-up with [facility].  Please continue current medications.  Labs are excellent.  Continue to watch her blood pressure at home.  May return in 1 year or as needed.

## 2023-11-02 ENCOUNTER — Other Ambulatory Visit: Payer: Self-pay | Admitting: Internal Medicine

## 2023-11-17 DIAGNOSIS — N182 Chronic kidney disease, stage 2 (mild): Secondary | ICD-10-CM | POA: Diagnosis not present

## 2023-11-17 DIAGNOSIS — E559 Vitamin D deficiency, unspecified: Secondary | ICD-10-CM | POA: Diagnosis not present

## 2023-11-26 DIAGNOSIS — E559 Vitamin D deficiency, unspecified: Secondary | ICD-10-CM | POA: Diagnosis not present

## 2023-11-26 DIAGNOSIS — I129 Hypertensive chronic kidney disease with stage 1 through stage 4 chronic kidney disease, or unspecified chronic kidney disease: Secondary | ICD-10-CM | POA: Diagnosis not present

## 2023-11-26 DIAGNOSIS — R809 Proteinuria, unspecified: Secondary | ICD-10-CM | POA: Diagnosis not present

## 2023-11-26 DIAGNOSIS — N182 Chronic kidney disease, stage 2 (mild): Secondary | ICD-10-CM | POA: Diagnosis not present

## 2023-12-07 ENCOUNTER — Other Ambulatory Visit: Payer: Self-pay | Admitting: Internal Medicine

## 2024-01-07 ENCOUNTER — Other Ambulatory Visit: Payer: Self-pay | Admitting: Internal Medicine

## 2024-02-02 ENCOUNTER — Other Ambulatory Visit: Payer: Self-pay | Admitting: Internal Medicine

## 2024-02-11 ENCOUNTER — Other Ambulatory Visit: Payer: Self-pay | Admitting: Internal Medicine

## 2024-02-11 ENCOUNTER — Other Ambulatory Visit: Payer: Self-pay

## 2024-02-11 DIAGNOSIS — I10 Essential (primary) hypertension: Secondary | ICD-10-CM

## 2024-02-11 MED ORDER — LOSARTAN POTASSIUM-HCTZ 100-25 MG PO TABS: 1.0000 | ORAL_TABLET | Freq: Every day | ORAL | 3 refills | Status: AC

## 2024-02-11 MED ORDER — AMLODIPINE BESYLATE 5 MG PO TABS: 5.0000 mg | ORAL_TABLET | Freq: Every day | ORAL | 3 refills | Status: AC

## 2024-03-07 DIAGNOSIS — H35033 Hypertensive retinopathy, bilateral: Secondary | ICD-10-CM | POA: Diagnosis not present

## 2024-03-07 DIAGNOSIS — H35371 Puckering of macula, right eye: Secondary | ICD-10-CM | POA: Diagnosis not present

## 2024-03-07 DIAGNOSIS — H26491 Other secondary cataract, right eye: Secondary | ICD-10-CM | POA: Diagnosis not present

## 2024-03-07 DIAGNOSIS — H538 Other visual disturbances: Secondary | ICD-10-CM | POA: Diagnosis not present

## 2024-04-04 DIAGNOSIS — I10 Essential (primary) hypertension: Secondary | ICD-10-CM | POA: Diagnosis not present

## 2024-04-04 DIAGNOSIS — H26491 Other secondary cataract, right eye: Secondary | ICD-10-CM | POA: Diagnosis not present

## 2024-05-19 DIAGNOSIS — L820 Inflamed seborrheic keratosis: Secondary | ICD-10-CM | POA: Diagnosis not present

## 2024-05-19 DIAGNOSIS — L814 Other melanin hyperpigmentation: Secondary | ICD-10-CM | POA: Diagnosis not present

## 2024-05-19 DIAGNOSIS — D225 Melanocytic nevi of trunk: Secondary | ICD-10-CM | POA: Diagnosis not present

## 2024-05-19 DIAGNOSIS — L821 Other seborrheic keratosis: Secondary | ICD-10-CM | POA: Diagnosis not present

## 2024-05-25 DIAGNOSIS — M13841 Other specified arthritis, right hand: Secondary | ICD-10-CM | POA: Diagnosis not present

## 2024-05-25 DIAGNOSIS — M13831 Other specified arthritis, right wrist: Secondary | ICD-10-CM | POA: Diagnosis not present

## 2024-07-11 DIAGNOSIS — M13831 Other specified arthritis, right wrist: Secondary | ICD-10-CM | POA: Diagnosis not present

## 2024-07-21 ENCOUNTER — Other Ambulatory Visit: Payer: Self-pay | Admitting: Internal Medicine

## 2024-09-05 DIAGNOSIS — M19041 Primary osteoarthritis, right hand: Secondary | ICD-10-CM | POA: Diagnosis not present

## 2024-09-05 DIAGNOSIS — M19031 Primary osteoarthritis, right wrist: Secondary | ICD-10-CM | POA: Diagnosis not present

## 2024-10-04 NOTE — Progress Notes (Signed)
 "  Annual Wellness Visit   Patient Care Team: Perri Ronal PARAS, MD as PCP - General (Internal Medicine)  Visit Date: 10/18/2024   Chief Complaint  Patient presents with   Annual Exam   Medicare Wellness   Subjective:  Patient: Andrew Cole, Male DOB: 1946/07/10, 78 y.o. MRN: [id number] Vitals:   10/18/24 1052  BP: 130/70   Andrew Cole is a 78 y.o. Male who presents today for his Annual Wellness Visit.    Had a right middle finger arthroplasty and a total wrist arthroplasty on 09/27/2024.   History of Hypertension treated with 5 mg Amlodipine  and 100-25 mg Losartan -Hydrochlorothiazide .    History of Hyperlipidemia treated with Rosuvastatin . 10/11/2024 lipid panel WNL.     History of Allergy/Asthma treated with 108 mcg Albuterol  inhaler PRN and 50 mcg Flonase  daily.    History of chronic right hip pain has started to re-occur 1.5 years after receiving intra-articular injection.     Labs 10/11/2024  MCH 33.7,  Microalbumin creatinine ratio 1,106, Otherwise WNL.   01/07/2022 Colonoscopy 6 polyps found in the ascending colon and sigmoid colon. Resected and retrieved. Repeat in 3 years.  Last colonoscopy was performed by Dr. Luis.  Health maintenance: Colonoscopy due in the very near future.  Vaccine counseling: UTD on Covid-19 and Influenza vaccines.  Health Maintenance  Topic Date Due   Medicare Annual Wellness (AWV)  10/14/2024   Colonoscopy  04/25/2025 (Originally 01/07/2025)   COVID-19 Vaccine (10 - 2025-26 season) 02/16/2025   DTaP/Tdap/Td (3 - Td or Tdap) 01/17/2033   Pneumococcal Vaccine: 50+ Years  Completed   Influenza Vaccine  Completed   Hepatitis C Screening  Completed   Zoster Vaccines- Shingrix  Completed   Meningococcal B Vaccine  Aged Out   Review of Systems  Constitutional:  Negative for fever and malaise/fatigue.  HENT:  Negative for congestion.   Eyes:  Negative for blurred vision.  Respiratory:  Negative for cough and shortness of breath.    Cardiovascular:  Negative for chest pain, palpitations and leg swelling.  Gastrointestinal:  Negative for vomiting.  Musculoskeletal:  Negative for back pain.  Skin:  Negative for rash.  Neurological:  Negative for loss of consciousness and headaches.   Objective:  Vitals: body mass index is 28.08 kg/m. Today's Vitals   10/18/24 1052  BP: 130/70  Pulse: 72  SpO2: 98%  Weight: 174 lb (78.9 kg)  PainSc: 2    Physical Exam Vitals and nursing note reviewed. Exam conducted with a chaperone present.  Constitutional:      General: He is awake. He is not in acute distress.    Appearance: Normal appearance. He is not ill-appearing or toxic-appearing.  HENT:     Head: Normocephalic and atraumatic.     Right Ear: Tympanic membrane, ear canal and external ear normal.     Left Ear: Tympanic membrane, ear canal and external ear normal.     Mouth/Throat:     Pharynx: Oropharynx is clear.  Eyes:     Extraocular Movements: Extraocular movements intact.     Pupils: Pupils are equal, round, and reactive to light.  Neck:     Thyroid: No thyroid mass, thyromegaly or thyroid tenderness.     Vascular: No carotid bruit.  Cardiovascular:     Rate and Rhythm: Normal rate and regular rhythm. Occasional Extrasystoles are present.    Pulses:          Dorsalis pedis pulses are 2+ on the right side and 2+  on the left side.       Posterior tibial pulses are 2+ on the right side and 2+ on the left side.     Heart sounds: Normal heart sounds. No murmur heard.    No friction rub. No gallop.  Pulmonary:     Effort: Pulmonary effort is normal.     Breath sounds: Normal breath sounds. No decreased breath sounds, wheezing, rhonchi or rales.  Chest:     Chest wall: No mass.  Abdominal:     Palpations: Abdomen is soft. There is no hepatomegaly, splenomegaly or mass.     Tenderness: There is no abdominal tenderness.     Hernia: No hernia is present.  Genitourinary:    Prostate: Normal. Not enlarged, not  tender and no nodules present.     Rectum: Normal. Guaiac result negative.  Musculoskeletal:     Cervical back: Normal range of motion.     Right lower leg: No edema.     Left lower leg: No edema.  Lymphadenopathy:     Cervical: No cervical adenopathy.     Upper Body:     Right upper body: No supraclavicular adenopathy.     Left upper body: No supraclavicular adenopathy.  Skin:    General: Skin is warm and dry.  Neurological:     General: No focal deficit present.     Mental Status: He is alert and oriented to person, place, and time. Mental status is at baseline.     Cranial Nerves: Cranial nerves 2-12 are intact.     Sensory: Sensation is intact.     Motor: Motor function is intact.     Coordination: Coordination is intact.     Gait: Gait is intact.     Deep Tendon Reflexes: Reflexes are normal and symmetric.  Psychiatric:        Attention and Perception: Attention normal.        Mood and Affect: Mood normal.        Speech: Speech normal.        Behavior: Behavior normal. Behavior is cooperative.        Thought Content: Thought content normal.        Cognition and Memory: Cognition and memory normal.        Judgment: Judgment normal.     Current Outpatient Medications  Medication Instructions   albuterol  (VENTOLIN  HFA) 108 (90 Base) MCG/ACT inhaler INHALE 2 PUFFS EVERY 6 HOURS AS NEEDED FOR WHEEZING/SHORTNESS OF BREATH   ALPRAZolam  (XANAX ) 0.5 mg, Oral, 2 times daily PRN   amLODipine  (NORVASC ) 5 mg, Oral, Daily   amLODipine  (NORVASC ) 5 mg, Oral, Daily   famotidine (PEPCID) 10 mg, Daily   Farxiga 5 mg, Oral, Daily   fluticasone  (FLONASE ) 50 MCG/ACT nasal spray Use 2 sprays in each  nostril daily   hydrocortisone  (ANUSOL -HC) 2.5 % rectal cream Rectal, 2 times daily   losartan -hydrochlorothiazide  (HYZAAR) 100-25 MG tablet 1 tablet, Oral, Daily   losartan -hydrochlorothiazide  (HYZAAR) 100-25 MG tablet 1 tablet, Oral, Daily   rosuvastatin  (CRESTOR ) 10 mg, Oral, Daily   Past  Medical History:  Diagnosis Date   Adenomatous polyps    Allergy    Asthma    Hyperlipidemia    Hypertension    Impotence    Medical/Surgical History Narrative:  Allergic/Intolerant to:  Allergies  Allergen Reactions   Tessalon  [Benzonatate ] Other (See Comments)    Bronchospasm   Penicillins Other (See Comments)    GI intolerance   Zocor [Simvastatin] Other (See  Comments)    Myalgias    Past Surgical History:  Procedure Laterality Date   CATARACT EXTRACTION, BILATERAL Bilateral 04/2016   Family History  Problem Relation Age of Onset   Cancer Mother    Cancer Father     Social history: He is a widower. He is a retired runner, broadcasting/film/video. Enjoys physical labor such as fence building. He is in excellent physical condition. Non-smoker. 2 sons.    Most Recent Health Risks Assessment:   Most Recent Social Determinants of Health (Including Hx of Tobacco, Alcohol, and Drug Use) SDOH Screenings   Food Insecurity: No Food Insecurity (10/18/2024)  Housing: Low Risk (10/18/2024)  Transportation Needs: No Transportation Needs (10/17/2024)  Utilities: Not At Risk (10/15/2023)  Alcohol Screen: Low Risk (10/17/2024)  Depression (PHQ2-9): Low Risk (10/15/2023)  Financial Resource Strain: Low Risk (10/17/2024)  Physical Activity: Sufficiently Active (10/17/2024)  Social Connections: Socially Isolated (10/17/2024)  Stress: No Stress Concern Present (10/17/2024)  Tobacco Use: Medium Risk (10/18/2024)  Health Literacy: Adequate Health Literacy (10/15/2023)   Social History   Tobacco Use   Smoking status: Former   Smokeless tobacco: Never  Substance Use Topics   Alcohol use: Yes    Alcohol/week: 1.0 - 2.0 standard drink of alcohol    Types: 1 - 2 Glasses of wine per week   Drug use: No   Most Recent Functional Status Assessment:    10/17/2024   11:36 AM  In your present state of health, do you have any difficulty performing the following activities:  Hearing? 0   Vision? 0       Manually entered by patient   Most Recent Fall Risk Assessment:    10/18/2024   10:48 AM  Fall Risk   Falls in the past year? 0  Number falls in past yr: 0  Injury with Fall? 0  Risk for fall due to : No Fall Risks  Follow up Falls prevention discussed;Education provided;Falls evaluation completed   Most Recent Anxiety/Depression Screenings:    10/15/2023    3:10 PM 10/09/2022    2:10 PM  PHQ 2/9 Scores  PHQ - 2 Score 0 2  PHQ- 9 Score  2      Data saved with a previous flowsheet row definition    Most Recent Cognitive Screening:    10/15/2023    3:13 PM  6CIT Screen  What Year? 0 points  What month? 0 points  What time? 0 points  Count back from 20 0 points  Months in reverse 0 points  Repeat phrase 0 points  Total Score 0 points   Results:  Studies Obtained And Personally Reviewed By Me:  01/07/2022 Colonoscopy 6 polyps found in the ascending colon and sigmoid colon. Resected and retrieved. Repeat in 3 years.   Labs:  CBC w/ Differential Lab Results  Component Value Date   WBC 6.5 10/11/2024   RBC 4.48 10/11/2024   HGB 15.1 10/11/2024   HCT 43.8 10/11/2024   PLT 265 10/11/2024   MCV 97.8 10/11/2024   MCH 33.7 (H) 10/11/2024   MCHC 34.5 10/11/2024   RDW 11.9 10/11/2024   MPV 9.1 10/11/2024   LYMPHSABS 2,266 10/07/2022   MONOABS 1.0 10/30/2016   BASOSABS 33 10/11/2024    Comprehensive Metabolic Panel Lab Results  Component Value Date   NA 137 10/11/2024   NA 137 10/11/2024   K 4.1 10/11/2024   K 4.1 10/11/2024   CL 99 10/11/2024   CL 99 10/11/2024   CO2 28 10/11/2024  CO2 28 10/11/2024   GLUCOSE 96 10/11/2024   GLUCOSE 96 10/11/2024   BUN 18 10/11/2024   BUN 18 10/11/2024   CREATININE 0.89 10/11/2024   CREATININE 0.89 10/11/2024   CALCIUM  9.8 10/11/2024   CALCIUM  9.8 10/11/2024   CALCIUM  9.8 10/11/2024   PROT 6.5 10/11/2024   ALBUMIN 4.5 05/07/2017   AST 21 10/11/2024   ALT 24 10/11/2024   ALKPHOS 54 05/07/2017   BILITOT 0.5  10/11/2024   EGFR 88 10/11/2024   GFRNONAA 80 09/24/2020   Lipid Panel  Lab Results  Component Value Date   CHOL 189 10/11/2024   HDL 83 10/11/2024   LDLCALC 83 10/11/2024   TRIG 131 10/11/2024   A1c Lab Results  Component Value Date   HGBA1C 5.5 10/11/2024    PSA 2.33  Assessment & Plan:   Had a right middle finger arthroplasty and a total wrist arthroplasty on 09/27/2024.   Hypertension: treated with 5 mg Amlodipine  and 100-25 mg Losartan -Hydrochlorothiazide .    Hyperlipidemia: treated with Rosuvastatin . 10/11/2024 lipid panel WNL.     Allergy/Asthma: treated with 108 mcg Albuterol  inhaler PRN and 50 mcg Flonase  daily.    01/07/2022 Colonoscopy 6 polyps found in the ascending colon and sigmoid colon. Resected and retrieved. Repeat in 3 years.    Health maintenance: Colonoscopy due.  Hx of adenomatous colon polyps  Vaccine counseling: UTD on Covid-19 and Influenza vaccines.     Annual Wellness Visit done today including the all of the following: Reviewed patient's Family Medical History Reviewed patient's SDOH and reviewed tobacco, alcohol, and drug use.  Reviewed and updated list of patient's medical providers Assessment of cognitive impairment was done Assessed patient's functional ability Established a written schedule for health screening services Health Risk Assessent Completed and Reviewed  Discussed health benefits of physical activity, and encouraged him to engage in regular exercise appropriate for his age and condition.    I,Makayla C Reid,acting as a scribe for Ronal JINNY Hailstone, MD.,have documented all relevant documentation on the behalf of Ronal JINNY Hailstone, MD,as directed by  Ronal JINNY Hailstone, MD while in the presence of Ronal JINNY Hailstone, MD.  I, Ronal JINNY Hailstone, MD, have reviewed all documentation for and agree with the above Annual Wellness Visit documentation.  Ronal JINNY Hailstone, MD Internal Medicine 10/18/2024 "

## 2024-10-11 ENCOUNTER — Other Ambulatory Visit: Payer: Medicare PPO

## 2024-10-11 DIAGNOSIS — I10 Essential (primary) hypertension: Secondary | ICD-10-CM

## 2024-10-11 DIAGNOSIS — Z0000 Encounter for general adult medical examination without abnormal findings: Secondary | ICD-10-CM

## 2024-10-11 DIAGNOSIS — E7800 Pure hypercholesterolemia, unspecified: Secondary | ICD-10-CM

## 2024-10-11 DIAGNOSIS — R809 Proteinuria, unspecified: Secondary | ICD-10-CM

## 2024-10-11 DIAGNOSIS — N182 Chronic kidney disease, stage 2 (mild): Secondary | ICD-10-CM

## 2024-10-12 LAB — URINALYSIS, ROUTINE W REFLEX MICROSCOPIC
Bacteria, UA: NONE SEEN /HPF
Bilirubin Urine: NEGATIVE
Hgb urine dipstick: NEGATIVE
Hyaline Cast: NONE SEEN /LPF
Leukocytes,Ua: NEGATIVE
Nitrite: NEGATIVE
Specific Gravity, Urine: 1.027 (ref 1.001–1.035)
Squamous Epithelial / HPF: NONE SEEN /HPF (ref <=5)
WBC, UA: NONE SEEN /HPF (ref 0–5)
pH: 7 (ref 5.0–8.0)

## 2024-10-12 LAB — CBC WITH DIFFERENTIAL/PLATELET
Absolute Lymphocytes: 1814 {cells}/uL (ref 850–3900)
Absolute Monocytes: 904 {cells}/uL (ref 200–950)
Basophils Absolute: 33 {cells}/uL (ref 0–200)
Basophils Relative: 0.5 %
Eosinophils Absolute: 182 {cells}/uL (ref 15–500)
Eosinophils Relative: 2.8 %
HCT: 43.8 % (ref 39.4–51.1)
Hemoglobin: 15.1 g/dL (ref 13.2–17.1)
MCH: 33.7 pg — ABNORMAL HIGH (ref 27.0–33.0)
MCHC: 34.5 g/dL (ref 31.6–35.4)
MCV: 97.8 fL (ref 81.4–101.7)
MPV: 9.1 fL (ref 7.5–12.5)
Monocytes Relative: 13.9 %
Neutro Abs: 3569 {cells}/uL (ref 1500–7800)
Neutrophils Relative %: 54.9 %
Platelets: 265 Thousand/uL (ref 140–400)
RBC: 4.48 Million/uL (ref 4.20–5.80)
RDW: 11.9 % (ref 11.0–15.0)
Total Lymphocyte: 27.9 %
WBC: 6.5 Thousand/uL (ref 3.8–10.8)

## 2024-10-12 LAB — LIPID PANEL
Cholesterol: 189 mg/dL (ref <200)
HDL: 83 mg/dL (ref >=40)
LDL Cholesterol (Calc): 83 mg/dL
Non-HDL Cholesterol (Calc): 106 mg/dL (ref <130)
Total CHOL/HDL Ratio: 2.3 (calc) (ref <5.0)
Triglycerides: 131 mg/dL (ref <150)

## 2024-10-12 LAB — PSA: PSA: 2.33 ng/mL (ref <=4.00)

## 2024-10-12 LAB — COMPREHENSIVE METABOLIC PANEL WITH GFR
AG Ratio: 2 (calc) (ref 1.0–2.5)
ALT: 24 U/L (ref 9–46)
AST: 21 U/L (ref 10–35)
Albumin: 4.3 g/dL (ref 3.6–5.1)
Alkaline phosphatase (APISO): 55 U/L (ref 35–144)
BUN: 18 mg/dL (ref 7–25)
CO2: 28 mmol/L (ref 20–32)
Calcium: 9.8 mg/dL (ref 8.6–10.3)
Chloride: 99 mmol/L (ref 98–110)
Creat: 0.89 mg/dL (ref 0.70–1.28)
Globulin: 2.2 g/dL (ref 1.9–3.7)
Glucose, Bld: 96 mg/dL (ref 65–99)
Potassium: 4.1 mmol/L (ref 3.5–5.3)
Sodium: 137 mmol/L (ref 135–146)
Total Bilirubin: 0.5 mg/dL (ref 0.2–1.2)
Total Protein: 6.5 g/dL (ref 6.1–8.1)
eGFR: 88 mL/min/1.73m2 (ref >=60)

## 2024-10-12 LAB — MICROALBUMIN / CREATININE URINE RATIO
Creatinine, Urine: 134 mg/dL (ref 20–320)
Microalb Creat Ratio: 1106 mg/g{creat} — ABNORMAL HIGH (ref <30)
Microalb, Ur: 148.2 mg/dL

## 2024-10-12 LAB — RENAL FUNCTION PANEL
Albumin: 4.3 g/dL (ref 3.6–5.1)
BUN: 18 mg/dL (ref 7–25)
CO2: 28 mmol/L (ref 20–32)
Calcium: 9.8 mg/dL (ref 8.6–10.3)
Chloride: 99 mmol/L (ref 98–110)
Creat: 0.89 mg/dL (ref 0.70–1.28)
Glucose, Bld: 96 mg/dL (ref 65–99)
Phosphorus: 3.9 mg/dL (ref 2.1–4.3)
Potassium: 4.1 mmol/L (ref 3.5–5.3)
Sodium: 137 mmol/L (ref 135–146)

## 2024-10-12 LAB — HEMOGLOBIN A1C
Hgb A1c MFr Bld: 5.5 % (ref <5.7)
Mean Plasma Glucose: 111 mg/dL
eAG (mmol/L): 6.2 mmol/L

## 2024-10-12 LAB — MICROSCOPIC MESSAGE

## 2024-10-12 LAB — PTH, INTACT AND CALCIUM
Calcium: 9.8 mg/dL (ref 8.6–10.3)
PTH: 36 pg/mL (ref 16–77)

## 2024-10-12 LAB — MAGNESIUM: Magnesium: 2.2 mg/dL (ref 1.5–2.5)

## 2024-10-12 LAB — VITAMIN D 25 HYDROXY (VIT D DEFICIENCY, FRACTURES): Vit D, 25-Hydroxy: 46 ng/mL (ref 30–100)

## 2024-10-12 NOTE — Progress Notes (Signed)
 Pharmacy Quality Measure Review  This patient is appearing on a report for being at risk of failing the adherence measure for diabetes medications this calendar year.   Medication: Farxiga 5 mg Last fill date: 09/26/24 for 90 day supply  Insurance report was not up to date. No action needed at this time.   Jenkins Graces, PharmD PGY1 Pharmacy Resident

## 2024-10-18 ENCOUNTER — Encounter: Payer: Self-pay | Admitting: Internal Medicine

## 2024-10-18 ENCOUNTER — Ambulatory Visit: Payer: Medicare PPO | Admitting: Internal Medicine

## 2024-10-18 VITALS — BP 130/70 | HR 72 | Wt 174.0 lb

## 2024-10-18 DIAGNOSIS — J45909 Unspecified asthma, uncomplicated: Secondary | ICD-10-CM

## 2024-10-18 DIAGNOSIS — Z0000 Encounter for general adult medical examination without abnormal findings: Secondary | ICD-10-CM | POA: Diagnosis not present

## 2024-10-18 DIAGNOSIS — E785 Hyperlipidemia, unspecified: Secondary | ICD-10-CM

## 2024-10-18 DIAGNOSIS — I10 Essential (primary) hypertension: Secondary | ICD-10-CM

## 2024-10-18 DIAGNOSIS — R809 Proteinuria, unspecified: Secondary | ICD-10-CM

## 2024-10-18 DIAGNOSIS — N182 Chronic kidney disease, stage 2 (mild): Secondary | ICD-10-CM

## 2024-10-18 DIAGNOSIS — Z96691 Finger-joint replacement of right hand: Secondary | ICD-10-CM

## 2024-10-18 DIAGNOSIS — Z96631 Presence of right artificial wrist joint: Secondary | ICD-10-CM

## 2024-10-18 DIAGNOSIS — Z860101 Personal history of adenomatous and serrated colon polyps: Secondary | ICD-10-CM

## 2024-10-18 DIAGNOSIS — E7800 Pure hypercholesterolemia, unspecified: Secondary | ICD-10-CM

## 2024-10-18 DIAGNOSIS — J301 Allergic rhinitis due to pollen: Secondary | ICD-10-CM

## 2024-10-18 NOTE — Patient Instructions (Addendum)
 Andrew Cole,  Thank you for taking the time for your Medicare Wellness Visit. I appreciate your continued commitment to your health goals. Please review the care plan we discussed, and feel free to reach out if I can assist you further.  Please note that Annual Wellness Visits do not include a physical exam. Some assessments may be limited, especially if the visit was conducted virtually. If needed, we may recommend an in-person follow-up with your provider.  Ongoing Care Seeing your primary care provider every 3 to 6 months helps us  monitor your health and provide consistent, personalized care.   Referrals If a referral was made during today's visit and you haven't received any updates within two weeks, please contact the referred provider directly to check on the status.  Recommended Screenings:  Health Maintenance  Topic Date Due   Medicare Annual Wellness Visit  10/14/2024   Colon Cancer Screening  04/25/2025*   COVID-19 Vaccine (10 - 2025-26 season) 02/16/2025   DTaP/Tdap/Td vaccine (3 - Td or Tdap) 01/17/2033   Pneumococcal Vaccine for age over 91  Completed   Flu Shot  Completed   Hepatitis C Screening  Completed   Zoster (Shingles) Vaccine  Completed   Meningitis B Vaccine  Aged Out  *Topic was postponed. The date shown is not the original due date.       10/09/2022    2:11 PM  Advanced Directives  Does Patient Have a Medical Advance Directive? Yes  Type of Estate Agent of [place];Living will  Does patient want to make changes to medical advance directive? No - Patient declined  Copy of Healthcare Power of Attorney in Chart? No - copy requested    Vision: Annual vision screenings are recommended for early detection of glaucoma, cataracts, and diabetic retinopathy. These exams can also reveal signs of chronic conditions such as diabetes and high blood pressure.  Dental: Annual dental screenings help detect early signs of oral cancer, gum disease, and  other conditions linked to overall health, including heart disease and diabetes.  Please see the attached documents for additional preventive care recommendations.   It was a pleasure to see you today. Labs are stable. Colonoscopy due. Will refer you to [facility].Continue current meds and return in one year or as needed.

## 2024-10-18 NOTE — Progress Notes (Signed)
 "  Chief Complaint  Patient presents with   Annual Exam   Medicare Wellness     Subjective:   Andrew Cole is a 79 y.o. male who presents for a Medicare Annual Wellness Visit.  Visit info / Clinical Intake: Medicare Wellness Visit Type:: Subsequent Annual Wellness Visit Persons participating in visit and providing information:: patient Medicare Wellness Visit Mode:: In-person (required for WTM) Interpreter Needed?: No Pre-visit prep was completed: yes AWV questionnaire completed by patient prior to visit?: yes Date:: 10/17/24 Living arrangements:: (!) lives alone Patient's Overall Health Status Rating: very good Typical amount of pain: some Does pain affect daily life?: no Are you currently prescribed opioids?: no  Dietary Habits and Nutritional Risks How many meals a day?: 3 Eats fruit and vegetables daily?: (Patient-Rptd) yes Most meals are obtained by: preparing own meals In the last 2 weeks, have you had any of the following?: none Diabetic:: no  Functional Status Activities of Daily Living (to include ambulation/medication): Independent Ambulation: Independent Medication Administration: Independent Home Management (perform basic housework or laundry): Independent Manage your own finances?: (!) no Primary transportation is: driving Concerns about vision?: no *vision screening is required for WTM* Concerns about hearing?: no  Fall Screening Falls in the past year?: 0 Number of falls in past year: 0 Was there an injury with Fall?: 0 Fall Risk Category Calculator: 0 Patient Fall Risk Level: Low Fall Risk  Fall Risk Patient at Risk for Falls Due to: No Fall Risks Fall risk Follow up: Falls prevention discussed; Education provided; Falls evaluation completed  Home and Transportation Safety: All rugs have non-skid backing?: yes All stairs or steps have railings?: yes Grab bars in the bathtub or shower?: yes Have non-skid surface in bathtub or shower?: yes Good  home lighting?: yes Regular seat belt use?: yes Hospital stays in the last year:: no  Cognitive Assessment Difficulty concentrating, remembering, or making decisions? : no Will 6CIT or Mini Cog be Completed: no 6CIT or Mini Cog Declined: patient alert, oriented, able to answer questions appropriately and recall recent events  Reviewed/Updated  Reviewed/Updated: Reviewed All (Medical, Surgical, Family, Medications, Allergies, Care Teams, Patient Goals)    Allergies (verified) Tessalon  [benzonatate ], Penicillins, and Zocor [simvastatin]   Current Medications (verified) Outpatient Encounter Medications as of 10/18/2024  Medication Sig   albuterol  (VENTOLIN  HFA) 108 (90 Base) MCG/ACT inhaler INHALE 2 PUFFS EVERY 6 HOURS AS NEEDED FOR WHEEZING/SHORTNESS OF BREATH   ALPRAZolam  (XANAX ) 0.5 MG tablet TAKE 1 TABLET BY MOUTH TWICE A DAY AS NEEDED   amLODipine  (NORVASC ) 5 MG tablet TAKE 1 TABLET EVERY DAY   amLODipine  (NORVASC ) 5 MG tablet Take 1 tablet (5 mg total) by mouth daily.   famotidine (PEPCID) 10 MG tablet Take 10 mg by mouth daily.   FARXIGA 5 MG TABS tablet Take 5 mg by mouth daily.   fluticasone  (FLONASE ) 50 MCG/ACT nasal spray Use 2 sprays in each  nostril daily   hydrocortisone  (ANUSOL -HC) 2.5 % rectal cream PLACE 1 APPLICATION RECTALLY 2 (TWO) TIMES DAILY.   losartan -hydrochlorothiazide  (HYZAAR) 100-25 MG tablet TAKE 1 TABLET EVERY DAY   losartan -hydrochlorothiazide  (HYZAAR) 100-25 MG tablet Take 1 tablet by mouth daily.   rosuvastatin  (CRESTOR ) 10 MG tablet TAKE 1 TABLET EVERY DAY   No facility-administered encounter medications on file as of 10/18/2024.    History: Past Medical History:  Diagnosis Date   Adenomatous polyps    Allergy    Asthma    Hyperlipidemia    Hypertension  Impotence    Past Surgical History:  Procedure Laterality Date   CATARACT EXTRACTION, BILATERAL Bilateral 04/2016   JOINT REPLACEMENT     Family History  Problem Relation Age of  Onset   Cancer Mother    Cancer Father    Social History   Occupational History   Not on file  Tobacco Use   Smoking status: Former   Smokeless tobacco: Never  Vaping Use   Vaping status: Never Used  Substance and Sexual Activity   Alcohol use: Yes    Alcohol/week: 2.0 standard drinks of alcohol    Types: 2 Cans of beer per week   Drug use: Yes    Types: Marijuana   Sexual activity: Not Currently   Tobacco Counseling Counseling given: Not Answered  SDOH Screenings   Food Insecurity: No Food Insecurity (10/18/2024)  Housing: Low Risk (10/18/2024)  Transportation Needs: No Transportation Needs (10/18/2024)  Utilities: Not At Risk (10/18/2024)  Alcohol Screen: Low Risk (10/18/2024)  Depression (PHQ2-9): Low Risk (10/18/2024)  Financial Resource Strain: Low Risk (10/18/2024)  Physical Activity: Sufficiently Active (10/18/2024)  Social Connections: Socially Isolated (10/18/2024)  Stress: No Stress Concern Present (10/18/2024)  Tobacco Use: Medium Risk (10/18/2024)  Health Literacy: Adequate Health Literacy (10/18/2024)   See flowsheets for full screening details  Depression Screen PHQ 2 & 9 Depression Scale- Over the past 2 weeks, how often have you been bothered by any of the following problems? Little interest or pleasure in doing things: 0 Feeling down, depressed, or hopeless (PHQ Adolescent also includes...irritable): 0 PHQ-2 Total Score: 0 Trouble falling or staying asleep, or sleeping too much: 0 Feeling tired or having little energy: 0 Poor appetite or overeating (PHQ Adolescent also includes...weight loss): 0 Feeling bad about yourself - or that you are a failure or have let yourself or your family down: 0 Trouble concentrating on things, such as reading the newspaper or watching television (PHQ Adolescent also includes...like school work): 0 Moving or speaking so slowly that other people could have noticed. Or the opposite - being so fidgety or restless that you  have been moving around a lot more than usual: 0 Thoughts that you would be better off dead, or of hurting yourself in some way: 0 PHQ-9 Total Score: 0 If you checked off any problems, how difficult have these problems made it for you to do your work, take care of things at home, or get along with other people?: Not difficult at all     Goals Addressed               This Visit's Progress     Activity and Exercise Increased (pt-stated)        Evidence-based guidance:  Review current exercise levels.  Assess patient perspective on exercise or activity level, barriers to increasing activity, motivation and readiness for change.  Recommend or set healthy exercise goal based on individual tolerance.  Encourage small steps toward making change in amount of exercise or activity.  Urge reduction of sedentary activities or screen time.  Promote group activities within the community or with family or support person.  Consider referral to rehabiliation therapist for assessment and exercise/activity plan.   Notes:              Objective:    Today's Vitals   10/18/24 1052  BP: 130/70  Pulse: 72  SpO2: 98%  Weight: 174 lb (78.9 kg)  PainSc: 2    Body mass index is 28.08 kg/m.  Hearing/Vision screen  No results found. Immunizations and Health Maintenance Health Maintenance  Topic Date Due   Colonoscopy  04/25/2025 (Originally 01/07/2025)   COVID-19 Vaccine (10 - 2025-26 season) 02/16/2025   Medicare Annual Wellness (AWV)  10/18/2025   DTaP/Tdap/Td (3 - Td or Tdap) 01/17/2033   Pneumococcal Vaccine: 50+ Years  Completed   Influenza Vaccine  Completed   Hepatitis C Screening  Completed   Zoster Vaccines- Shingrix  Completed   Meningococcal B Vaccine  Aged Out        Assessment/Plan:  This is a routine wellness examination for Shamir.  Patient Care Team: Perri Ronal PARAS, MD as PCP - General (Internal Medicine)  I have personally reviewed and noted the following in the  patients chart:   Medical and social history Use of alcohol, tobacco or illicit drugs  Current medications and supplements including opioid prescriptions. Functional ability and status Nutritional status Physical activity Advanced directives List of other physicians Hospitalizations, surgeries, and ER visits in previous 12 months Vitals Screenings to include cognitive, depression, and falls Referrals and appointments  No orders of the defined types were placed in this encounter.  In addition, I have reviewed and discussed with patient certain preventive protocols, quality metrics, and best practice recommendations. A written personalized care plan for preventive services as well as general preventive health recommendations were provided to patient.   Araceli Zelda, CMA   10/18/2024   Return in 1 year (on 10/18/2025).  After Visit Summary: (In Person-Printed) AVS printed and given to the patient  Nurse Notes: none  I, Ronal PARAS Perri, MD, have reviewed all documentation for this visit. The documentation on 10/18/2024 for the exam, diagnosis, procedures, and orders are all accurate and complete.  "

## 2024-10-23 ENCOUNTER — Encounter: Payer: Self-pay | Admitting: Internal Medicine

## 2024-10-24 ENCOUNTER — Encounter: Payer: Self-pay | Admitting: Internal Medicine

## 2024-11-24 NOTE — Progress Notes (Signed)
 Andrew Cole                                          MRN: [id number]   11/24/2024   The [facility] Quality Team Specialist reviewed this patient medical record for the purposes of chart review for care gap closure. The following were reviewed: chart review for care gap closure-controlling blood pressure.    [facility] Quality Team
